# Patient Record
Sex: Female | Born: 1935
Health system: Southern US, Community
[De-identification: ages and names within clinical notes are randomized; demographics above are authoritative.]

## PROBLEM LIST (undated history)

## (undated) DIAGNOSIS — M1990 Unspecified osteoarthritis, unspecified site: Secondary | ICD-10-CM

## (undated) DIAGNOSIS — I10 Essential (primary) hypertension: Secondary | ICD-10-CM

## (undated) DIAGNOSIS — E785 Hyperlipidemia, unspecified: Secondary | ICD-10-CM

## (undated) DIAGNOSIS — F0390 Unspecified dementia without behavioral disturbance: Secondary | ICD-10-CM

## (undated) DIAGNOSIS — I2510 Atherosclerotic heart disease of native coronary artery without angina pectoris: Secondary | ICD-10-CM

## (undated) DIAGNOSIS — K219 Gastro-esophageal reflux disease without esophagitis: Secondary | ICD-10-CM

## (undated) DIAGNOSIS — M419 Scoliosis, unspecified: Secondary | ICD-10-CM

## (undated) DIAGNOSIS — M8580 Other specified disorders of bone density and structure, unspecified site: Secondary | ICD-10-CM

## (undated) DIAGNOSIS — E039 Hypothyroidism, unspecified: Secondary | ICD-10-CM

## (undated) HISTORY — PX: BACK SURGERY: SHX140

## (undated) HISTORY — DX: Gastro-esophageal reflux disease without esophagitis: K21.9

## (undated) HISTORY — DX: Essential (primary) hypertension: I10

## (undated) HISTORY — DX: Hypothyroidism, unspecified: E03.9

## (undated) HISTORY — DX: Other specified disorders of bone density and structure, unspecified site: M85.80

## (undated) HISTORY — PX: APPENDECTOMY: SHX54

## (undated) HISTORY — DX: Unspecified osteoarthritis, unspecified site: M19.90

## (undated) HISTORY — DX: Atherosclerotic heart disease of native coronary artery without angina pectoris: I25.10

## (undated) HISTORY — DX: Hyperlipidemia, unspecified: E78.5

## (undated) HISTORY — DX: Scoliosis, unspecified: M41.9

## (undated) HISTORY — DX: Unspecified dementia, unspecified severity, without behavioral disturbance, psychotic disturbance, mood disturbance, and anxiety: F03.90

## (undated) HISTORY — PX: ABDOMINAL HYSTERECTOMY: SHX81

---

## 1997-09-22 ENCOUNTER — Inpatient Hospital Stay (HOSPITAL_COMMUNITY): Admission: RE | Admit: 1997-09-22 | Discharge: 1997-09-25 | Payer: Self-pay | Admitting: Orthopedic Surgery

## 1999-08-21 ENCOUNTER — Other Ambulatory Visit: Admission: RE | Admit: 1999-08-21 | Discharge: 1999-08-21 | Payer: Self-pay | Admitting: Obstetrics and Gynecology

## 1999-12-04 ENCOUNTER — Inpatient Hospital Stay (HOSPITAL_COMMUNITY): Admission: AD | Admit: 1999-12-04 | Discharge: 1999-12-14 | Payer: Self-pay | Admitting: Cardiology

## 1999-12-05 ENCOUNTER — Encounter: Payer: Self-pay | Admitting: Cardiology

## 1999-12-06 ENCOUNTER — Encounter: Payer: Self-pay | Admitting: Cardiothoracic Surgery

## 1999-12-07 ENCOUNTER — Encounter: Payer: Self-pay | Admitting: Cardiothoracic Surgery

## 1999-12-08 ENCOUNTER — Encounter: Payer: Self-pay | Admitting: Cardiothoracic Surgery

## 1999-12-09 ENCOUNTER — Encounter: Payer: Self-pay | Admitting: Cardiothoracic Surgery

## 2010-12-30 ENCOUNTER — Emergency Department (HOSPITAL_COMMUNITY): Payer: Medicare Other

## 2010-12-30 ENCOUNTER — Emergency Department (HOSPITAL_COMMUNITY)
Admission: EM | Admit: 2010-12-30 | Discharge: 2010-12-30 | Disposition: A | Discharge status: Home / Self Care | Payer: Medicare Other | Attending: Emergency Medicine | Admitting: Emergency Medicine

## 2010-12-30 DIAGNOSIS — M545 Low back pain, unspecified: Secondary | ICD-10-CM | POA: Insufficient documentation

## 2010-12-30 DIAGNOSIS — M25559 Pain in unspecified hip: Secondary | ICD-10-CM | POA: Insufficient documentation

## 2010-12-30 DIAGNOSIS — I10 Essential (primary) hypertension: Secondary | ICD-10-CM | POA: Insufficient documentation

## 2010-12-30 DIAGNOSIS — M5430 Sciatica, unspecified side: Secondary | ICD-10-CM | POA: Insufficient documentation

## 2010-12-30 DIAGNOSIS — M79609 Pain in unspecified limb: Secondary | ICD-10-CM | POA: Insufficient documentation

## 2010-12-30 DIAGNOSIS — E7800 Pure hypercholesterolemia, unspecified: Secondary | ICD-10-CM | POA: Insufficient documentation

## 2012-06-02 IMAGING — CR DG HIP COMPLETE 2+V*R*
3 series · 3 of 3 positions shown · non-contrast
Comparison: None.

CLINICAL DATA: Right hip pain.

RIGHT HIP - COMPLETE 2+ VIEW

[t pelvis a.p.]
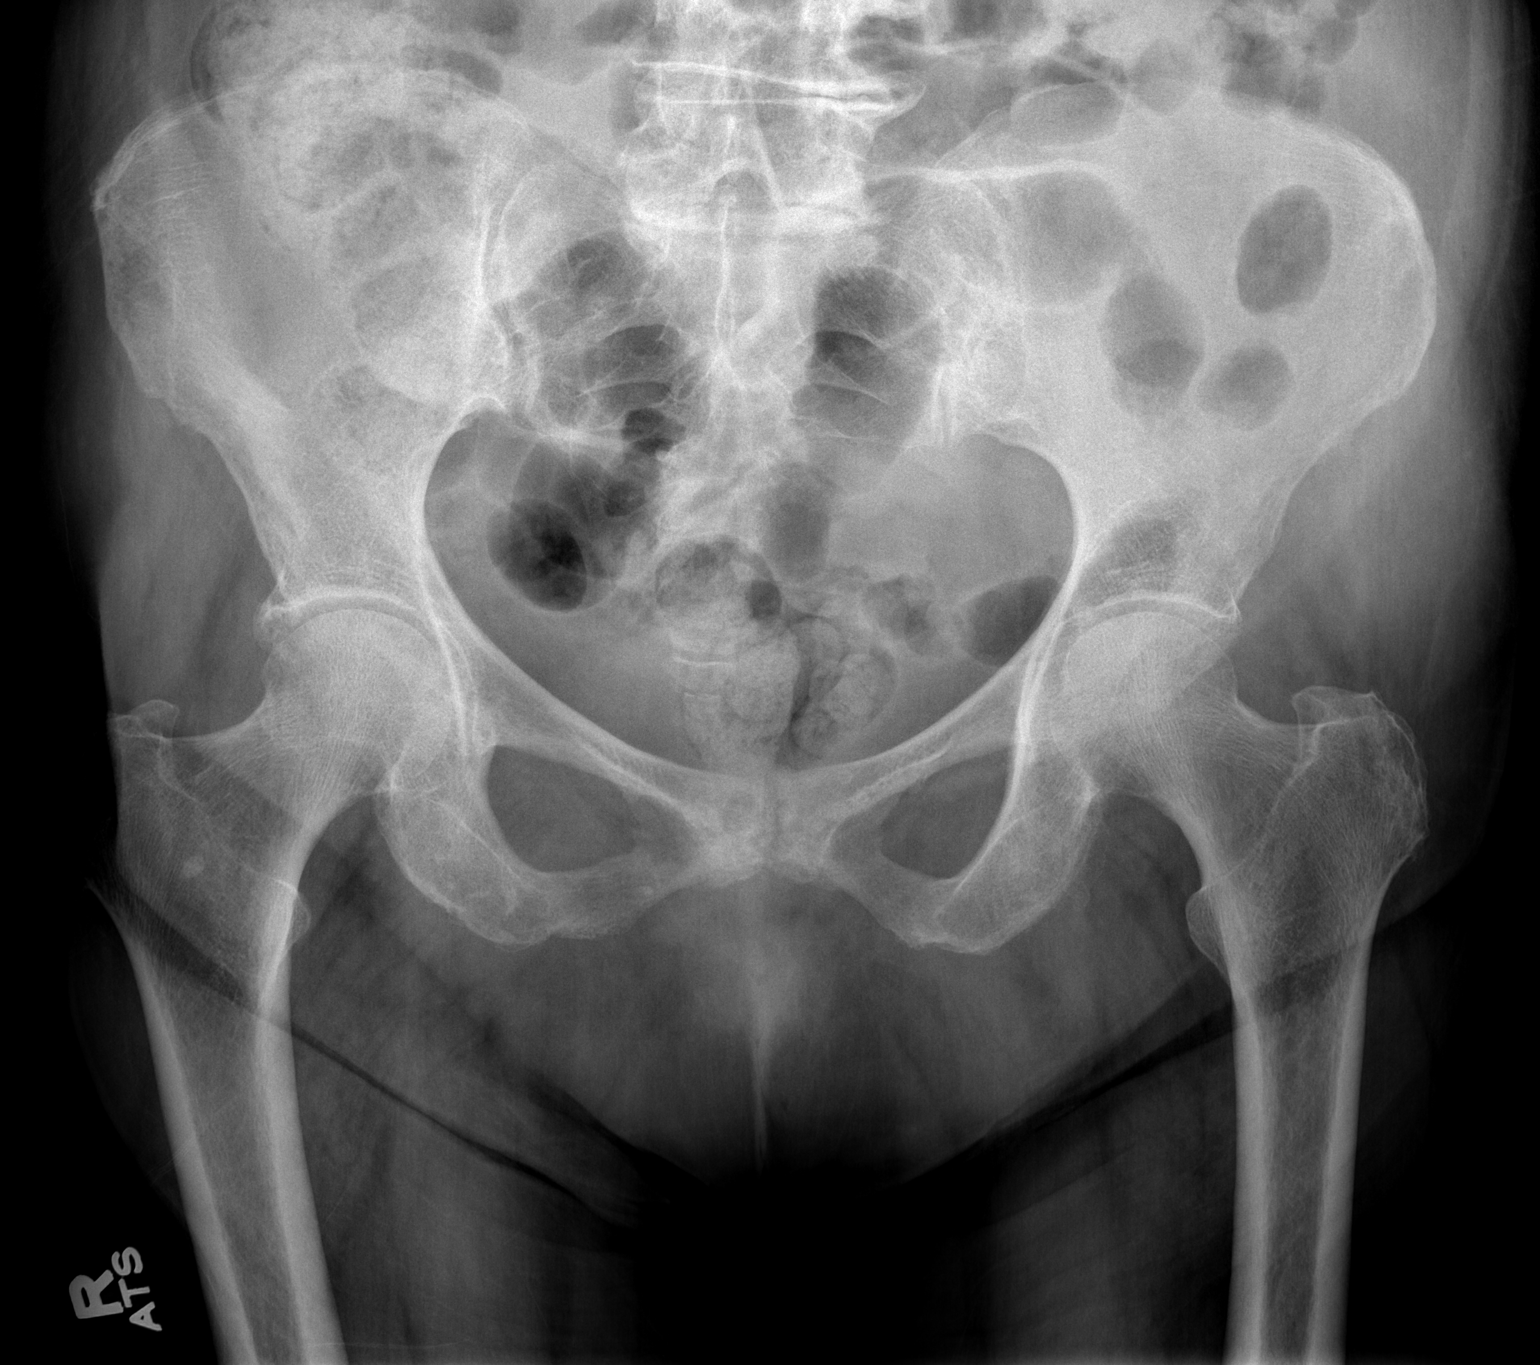

[t hip ap right]
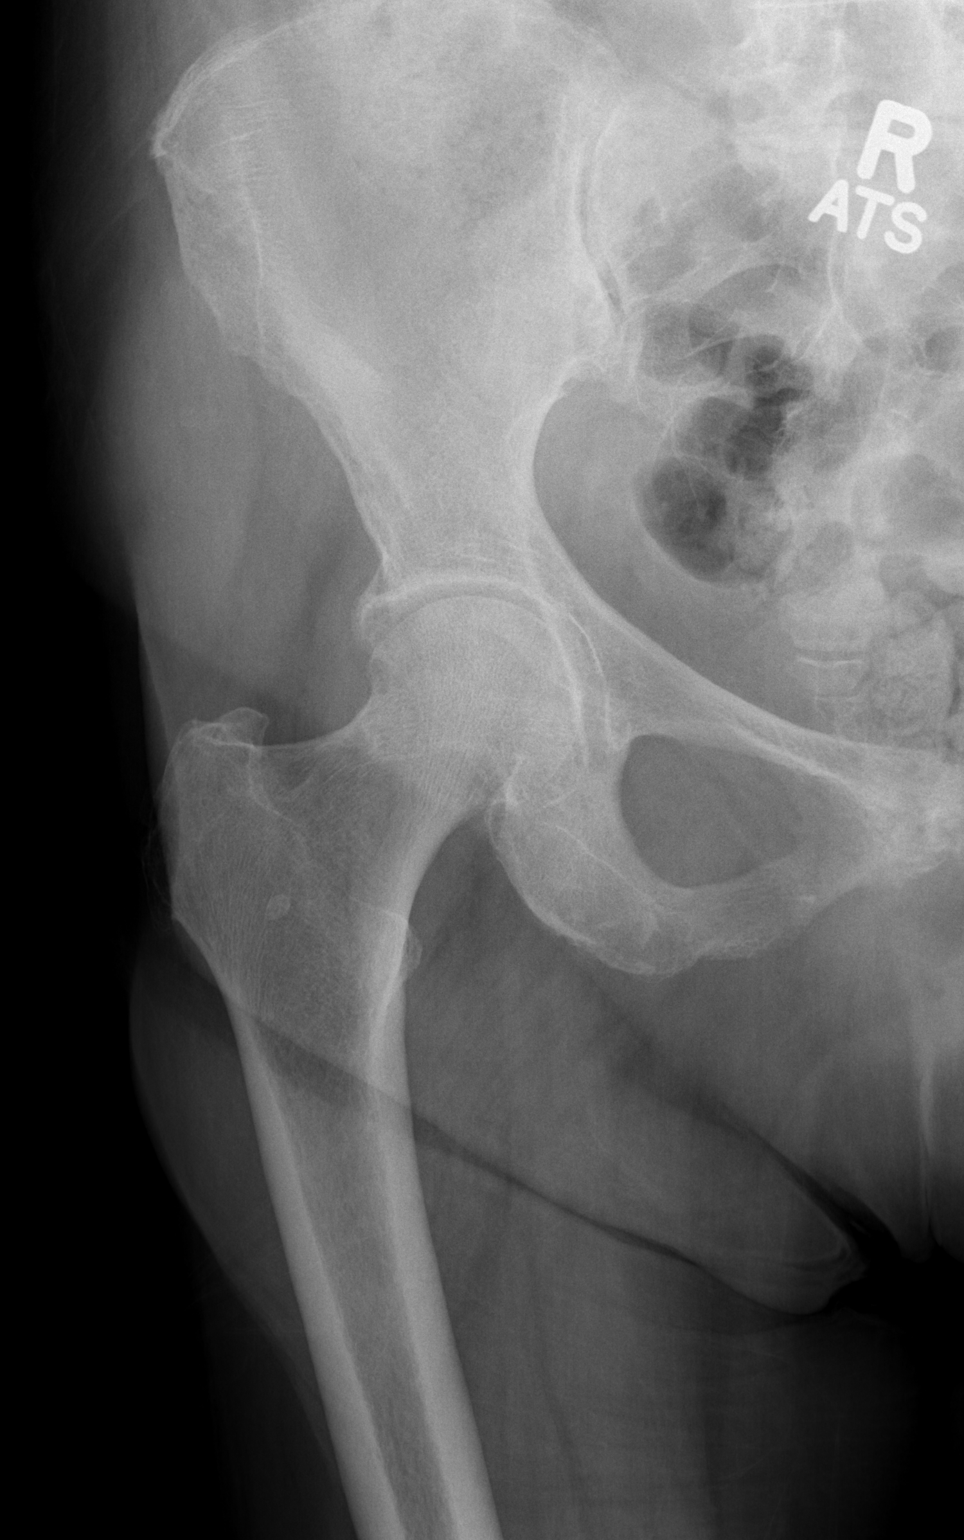

[t hip frog leg right]
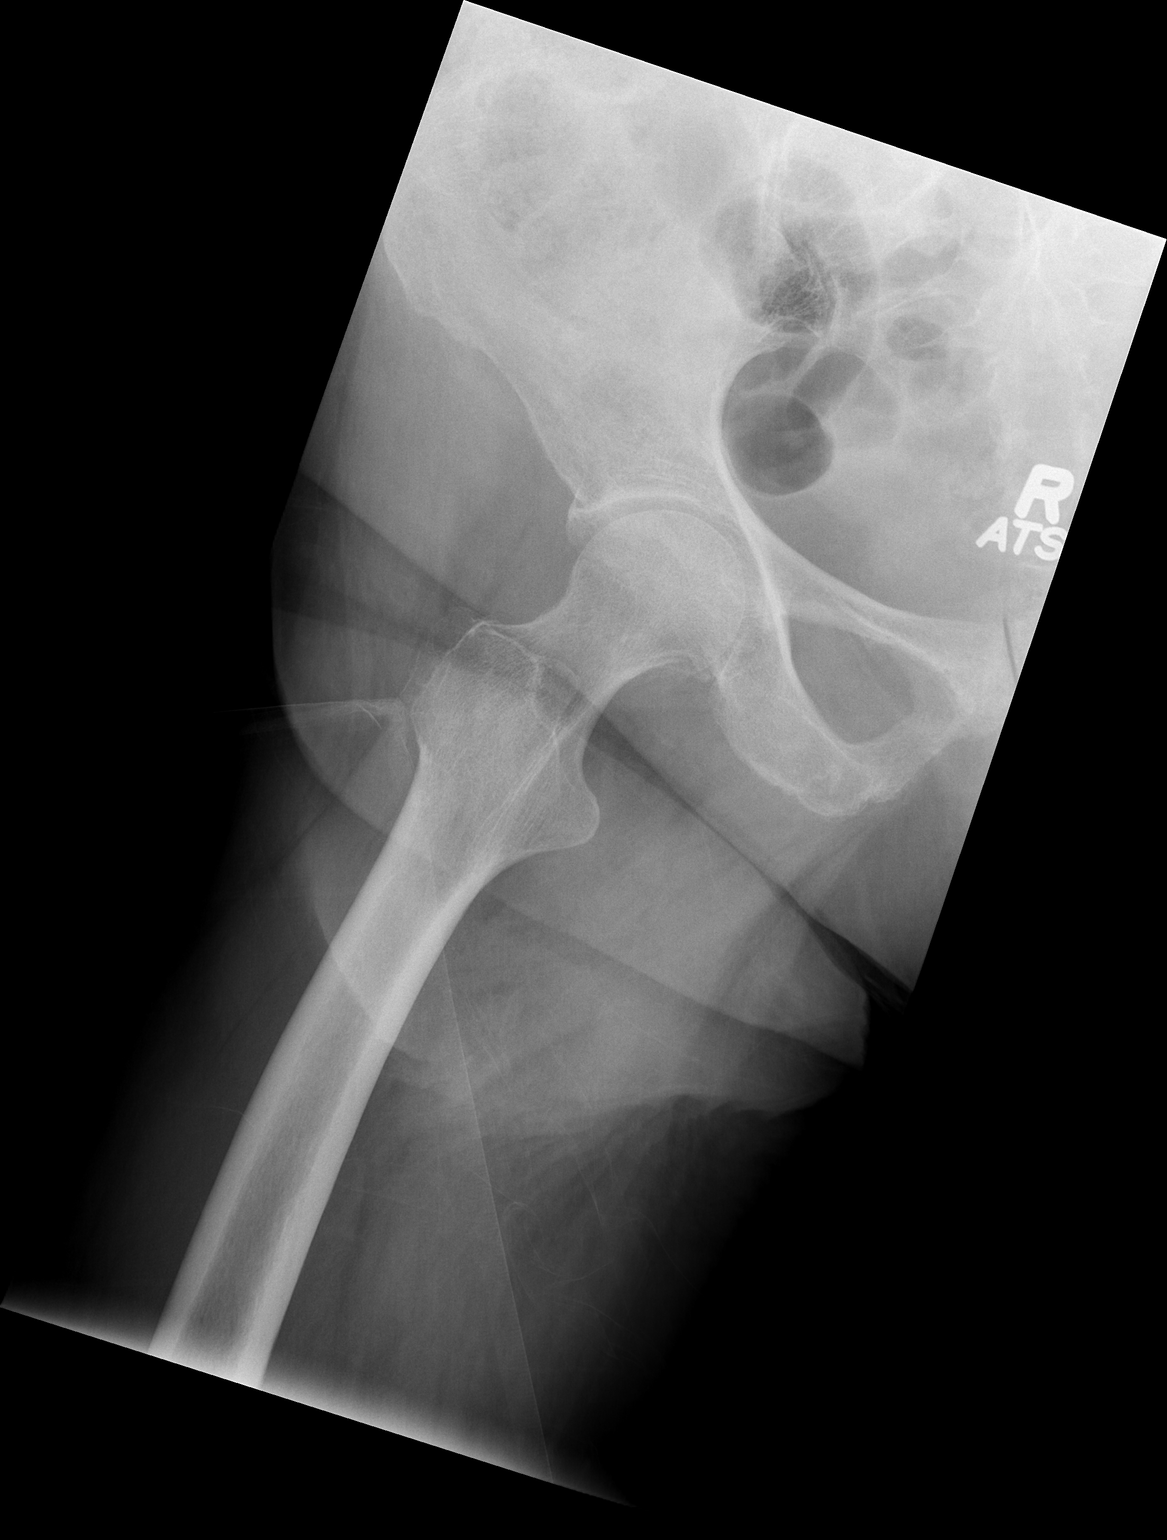

[3 of 3 positions shown; findings below may reference images not displayed]

FINDINGS: AP view of the pelvis and two views of the right hip were
obtained.  Degenerative changes at the pubic symphysis.  Stool in
the visualized colon.  Mild degenerative changes in the right hip.
No evidence for acute fracture or dislocation.
IMPRESSION: No acute bony abnormality to the pelvis or right hip.

Degenerative changes.

## 2013-04-16 ENCOUNTER — Encounter: Payer: Self-pay | Admitting: Surgery

## 2013-05-01 ENCOUNTER — Encounter: Payer: Self-pay | Admitting: Surgery

## 2013-05-04 ENCOUNTER — Ambulatory Visit (INDEPENDENT_AMBULATORY_CARE_PROVIDER_SITE_OTHER): Payer: Medicare Other | Admitting: Surgery

## 2013-05-04 ENCOUNTER — Encounter: Payer: Self-pay | Admitting: Surgery

## 2013-05-04 VITALS — BP 193/51 | HR 47 | Ht 61.0 in | Wt 141.4 lb

## 2013-05-04 DIAGNOSIS — I6529 Occlusion and stenosis of unspecified carotid artery: Secondary | ICD-10-CM

## 2013-05-04 NOTE — Addendum Note (Signed)
Addended by: Adria DillELDRIDGE-LEWIS, Terran Hollenkamp L on: 05/04/2013 04:25 PM   Modules accepted: Orders

## 2013-05-04 NOTE — Progress Notes (Signed)
Patient name: Regina ResidesSarah Joyce Garrelts MRN: [id number] DOB: May 14, 1919 Sex: female   Referred by: Dr. Shary DecampGrisso  Reason for referral:  Chief Complaint  Patient presents with  . Carotid    new pt    HISTORY OF PRESENT ILLNESS: This is a very pleasant 80 year old female who was referred for further evaluation of carotid occlusive disease.  During a workup for vertigo she had a ultrasound which showed greater than 70% stenosis in her left carotid artery.  This was followed up with a CT angiogram which confirmed 70% stenosis in the left carotid artery.  Both vertebral arteries are widely patent.  There is no significant stenosis within the right internal carotid artery.  The patient is asymptomatic.  Specifically, she denies numbness or weakness in either extremity.  She denies slurred speech.  She denies amaurosis fugax.  The patient hypercholesterolemia is managed with a statin.  She is on single agent antiplatelet therapy with aspirin.  She is a nonsmoker.  Past Medical History  Diagnosis Date  . CAD (coronary artery disease)   . Dementia   . GERD (gastroesophageal reflux disease)   . Hyperlipidemia   . Hypertension   . Hypothyroidism   . Osteoarthritis   . Osteopenia   . Scoliosis     Past Surgical History  Procedure Laterality Date  . Abdominal hysterectomy    . Appendectomy      History   Social History  . Marital Status: Widowed    Spouse Name: N/A    Number of Children: N/A  . Years of Education: N/A   Occupational History  . Not on file.   Social History Main Topics  . Smoking status: Former Smoker    Quit date: 03/12/1964  . Smokeless tobacco: Not on file  . Alcohol Use: No  . Drug Use: No  . Sexual Activity: Not on file   Other Topics Concern  . Not on file   Social History Narrative  . No narrative on file    Family History  Problem Relation Age of Onset  . Cancer Mother   . Heart disease Father   . Cancer Daughter   . Hypertension Son      Allergies as of 05/04/2013 - Review Complete 05/04/2013  Allergen Reaction Noted  . Carvedilol Other (See Comments) 04/16/2013  . Codeine sulfate Nausea Only 04/16/2013    Current Outpatient Prescriptions on File Prior to Visit  Medication Sig Dispense Refill  . levothyroxine (SYNTHROID, LEVOTHROID) 50 MCG tablet Take 50 mcg by mouth daily before breakfast.      . lisinopril (PRINIVIL,ZESTRIL) 40 MG tablet Take 40 mg by mouth daily.      . metoprolol succinate (TOPROL-XL) 50 MG 24 hr tablet Take 50 mg by mouth daily. Take with or immediately following a meal.      . omeprazole (PRILOSEC) 40 MG capsule Take 40 mg by mouth daily.      . simvastatin (ZOCOR) 40 MG tablet Take 40 mg by mouth daily.       No current facility-administered medications on file prior to visit.     REVIEW OF SYSTEMS: Cardiovascular: No chest pain, chest pressure, palpitations, orthopnea, or dyspnea on exertion. No claudication or rest pain,  No history of DVT or phlebitis. Pulmonary: No productive cough, asthma or wheezing. Neurologic: No weakness, paresthesias, aphasia, or amaurosis.  Positive for dizziness. Hematologic: No bleeding problems or clotting disorders. Musculoskeletal: No joint pain or joint swelling. Gastrointestinal: No blood in stool or  hematemesis Genitourinary: No dysuria or hematuria. Psychiatric:: No history of major depression. Integumentary: No rashes or ulcers. Constitutional: No fever or chills.  PHYSICAL EXAMINATION: General: The patient appears their stated age.  Vital signs are BP 193/51  Pulse 47  Ht 5\' 1"  (1.549 m)  Wt 141 lb 6.4 oz (64.139 kg)  BMI 26.73 kg/m2  SpO2 100% HEENT:  No gross abnormalities Pulmonary: Respirations are non-labored Abdomen: Soft and non-tender  Musculoskeletal: There are no major deformities.   Neurologic: No focal weakness or paresthesias are detected, Skin: There are no ulcer or rashes noted. Psychiatric: The patient has normal  affect. Cardiovascular: There is a regular rate and rhythm without significant murmur appreciated.  No carotid bruits.  Palpable dorsalis pedis pulse bilaterally  Diagnostic Studies: I have reviewed her CT angiogram as well as her outside ultrasound which showed approximately 70% left carotid stenosis and no significant right carotid stenosis    Assessment:  Asymptomatic carotid disease, left greater than right Plan: I discussed with the patient that I do not feel her symptoms of dizziness are related to her carotid disease.  I feel that she is completely asymptomatic from her 70% left carotid stenosis.  I would recommend continued medical management, consisting of a statin for hypercholesterolemia, antiplatelet therapy with aspirin, and control of hypertension.  I will have her followup again in 6 months for a repeat carotid ultrasound.  We discussed the signs and symptoms of a stroke and what to do should they occur.     Jorge Ny, M.D. [facility] Office: [phone] Pager:  [phone]

## 2013-11-02 ENCOUNTER — Ambulatory Visit: Payer: Medicare Other | Admitting: Surgery

## 2013-11-02 ENCOUNTER — Other Ambulatory Visit (HOSPITAL_COMMUNITY): Payer: Medicare Other

## 2013-11-27 ENCOUNTER — Encounter: Payer: Self-pay | Admitting: Surgery

## 2013-11-30 ENCOUNTER — Ambulatory Visit (INDEPENDENT_AMBULATORY_CARE_PROVIDER_SITE_OTHER): Payer: Medicare Other | Admitting: Surgery

## 2013-11-30 ENCOUNTER — Ambulatory Visit (HOSPITAL_COMMUNITY)
Admission: RE | Admit: 2013-11-30 | Discharge: 2013-11-30 | Disposition: A | Discharge status: Home / Self Care | Payer: Medicare Other | Source: Ambulatory Visit | Attending: Surgery | Admitting: Surgery

## 2013-11-30 ENCOUNTER — Encounter: Payer: Self-pay | Admitting: Surgery

## 2013-11-30 VITALS — BP 160/52 | HR 52 | Ht 61.0 in | Wt 140.1 lb

## 2013-11-30 DIAGNOSIS — I6529 Occlusion and stenosis of unspecified carotid artery: Secondary | ICD-10-CM

## 2013-11-30 DIAGNOSIS — R2689 Other abnormalities of gait and mobility: Secondary | ICD-10-CM

## 2013-11-30 DIAGNOSIS — R29818 Other symptoms and signs involving the nervous system: Secondary | ICD-10-CM

## 2013-11-30 NOTE — Progress Notes (Signed)
HISTORY AND PHYSICAL     CC:  Follow up carotid duplex scan  Referring Provider:  Gordan Payment., MD  HPI: This is a 80 y.o. female who has known carotid stenosis is here for f/u carotid duplex scan.  Denies amaurosis fugax, paresthesias, or hemiparesis.  She states that her only symptom is "staggering" when she walks.  She states that this has been going on for a month, however, her son states that she went to see Dr. Shary Decamp about a year ago for the same problem.  She was referred for PT and went once and stopped.  She denies dizziness or any episodes of the room spinning.    She is on a statin for her cholesterol.   She is not on an aspirin.    Past Medical History  Diagnosis Date  . CAD (coronary artery disease)   . Dementia   . GERD (gastroesophageal reflux disease)   . Hyperlipidemia   . Hypertension   . Hypothyroidism   . Osteoarthritis   . Osteopenia   . Scoliosis    Past Surgical History  Procedure Laterality Date  . Abdominal hysterectomy    . Appendectomy    . Back surgery      Allergies  Allergen Reactions  . Carvedilol Other (See Comments)    bleeding  . Codeine Sulfate Nausea Only    Current Outpatient Prescriptions  Medication Sig Dispense Refill  . levothyroxine (SYNTHROID, LEVOTHROID) 50 MCG tablet Take 50 mcg by mouth daily before breakfast.      . lisinopril (PRINIVIL,ZESTRIL) 40 MG tablet Take 40 mg by mouth daily.      . metoprolol succinate (TOPROL-XL) 50 MG 24 hr tablet Take 50 mg by mouth daily. Take with or immediately following a meal.      . omeprazole (PRILOSEC) 40 MG capsule Take 40 mg by mouth daily.      . simvastatin (ZOCOR) 40 MG tablet Take 40 mg by mouth daily.      Marland Kitchen AMLODIPINE BESYLATE PO Take by mouth.       No current facility-administered medications for this visit.    Pt's meds include: Statin:  Yes.   Beta Blocker:  No. Aspirin:  No. Other antiplatelets/anticoagulants:  No.   Family History  Problem Relation Age of  Onset  . Cancer Mother   . Heart disease Father   . Heart attack Father   . Cancer Daughter   . Hypertension Son   . Hyperlipidemia Son     History   Social History  . Marital Status: Widowed    Spouse Name: N/A    Number of Children: N/A  . Years of Education: N/A   Occupational History  . Not on file.   Social History Main Topics  . Smoking status: Former Smoker    Quit date: 03/12/1964  . Smokeless tobacco: Not on file  . Alcohol Use: No  . Drug Use: No  . Sexual Activity: Not on file   Other Topics Concern  . Not on file   Social History Narrative  . No narrative on file     ROS:  Positive    Negative    All sytems reviewed and are negative  Cardiovascular:  chest pain/pressure  palpitations  SOB lying flat  DOE  pain in legs while walking  pain in feet when lying flat  hx of DVT  hx of phlebitis  swelling in legs  varicose veins  Pulmonary:  productive cough   asthma  wheezing  Neurologic:  weakness in  arms  legs-described as a staggering when walking  numbness in  arms  legs difficulty speaking or slurred speech  temporary loss of vision in one eye  dizziness  Hematologic:  bleeding problems  problems with blood clotting easily  GI  vomiting blood  blood in stool  GU:  burning with urination  blood in urine  Psychiatric:  hx of major depression  Integumentary:  rashes  ulcers  Constitutional:  fever  chills   PHYSICAL EXAMINATION:  Filed Vitals:   11/30/13 1327  BP: 195/65  Pulse:    Body mass index is 26.49 kg/(m^2).  General:  WDWN in NAD Gait: Normal HENT: WNL; normocephalic Eyes: PERRL Pulmonary: normal non-labored breathing , without Rales, rhonchi,  wheezing Cardiac: RRR, without  Murmurs, rubs or gallops; without carotid bruits Abdomen: soft, NT, no masses Skin: without rashes,  ulcers  Vascular Exam/Pulses:   Right Left    Radial 2+ (normal) 2+ (normal)  Ulnar 1+ (weak) 1+ (weak)  Popliteal Unable to palpate  Unable to palpate   DP 2+ (normal) 2+ (normal)  PT Unable to palpate  Unable to palpate     Extremities: without ischemic changes, without Gangrene , without cellulitis; without open wounds;  Musculoskeletal: without muscle wasting or atrophy  Neurologic: A&O X 3; Appropriate Affect ; SENSATION: normal; MOTOR FUNCTION:  moving all extremities equally. Speech is fluent/normal   Non-Invasive Vascular Imaging: Carotid Duplex Scan:  11/30/2013  1.  Less than 40% RICA stenosis 2.  60-79% LICA stenosis  Essentially unchanged from prior exam at outside facility  ASSESSMENT: 80 y.o. female here for f/u carotid duplex scan for her asymptomatic carotid artery stenosis.  PLAN: -pt overall doing well, but her main complaint is her staggering when she walks.  This most likely is not coming from her carotid artery stenosis.   -will refer her to a neurologist to help determine cause  -will have her f/u with Dr. Myra Gianotti in 6 months with a repeat carotid duplex.  Her BP was 195 systolic-we did recheck this at the end of the visit and it was down to 160 systolic.   Doreatha Massed, PA-C [facility] [phone]  Clinic MD:   Pt seen and examined in conjunction with Dr. Myra Gianotti  For VQI Use Only    PRE-ADM LIVING: [ x] Home,  Nursing home,  Homeless  AMB STATUS: [x ] Walking,  Walking w/ Assistance,  Wheelchair, Bed ridden  RECENT HEART ATTACK (<6 mon): No  CAD Sx: [x ] No,  Asx, h/o MI,  Stable angina,  Unstable angina  PRIOR CHF: [x ] No,  Asx,  Mild,  Moderate,  Severe  STRESS TEST: [x ] No,  Normal,  + ischemia,  + MI,  Both    I agree with the above.  I have seen and examined the patient.  She has a 60-79% left carotid stenosis.  I do not feel that this is contributing to her symptoms.  I suspect that she has some inner  ear disorder.  I am going to refer her to neurology for further evaluation.  I will see her back in 6 months for a repeat carotid ultrasound  Wells Brabham

## 2013-11-30 NOTE — Addendum Note (Signed)
Addended by: Sharee Pimple on: 11/30/2013 05:49 PM   Modules accepted: Orders

## 2014-05-28 ENCOUNTER — Encounter: Payer: Self-pay | Admitting: Surgery

## 2014-05-31 ENCOUNTER — Ambulatory Visit: Payer: Medicare Other | Admitting: Surgery

## 2014-05-31 ENCOUNTER — Inpatient Hospital Stay (HOSPITAL_COMMUNITY): Admission: RE | Admit: 2014-05-31 | Payer: Medicare Other | Source: Ambulatory Visit

## 2019-06-27 DIAGNOSIS — E039 Hypothyroidism, unspecified: Secondary | ICD-10-CM

## 2019-06-27 DIAGNOSIS — R2681 Unsteadiness on feet: Secondary | ICD-10-CM

## 2019-06-27 DIAGNOSIS — R262 Difficulty in walking, not elsewhere classified: Secondary | ICD-10-CM

## 2019-06-27 DIAGNOSIS — E785 Hyperlipidemia, unspecified: Secondary | ICD-10-CM

## 2019-06-27 DIAGNOSIS — M5136 Other intervertebral disc degeneration, lumbar region: Secondary | ICD-10-CM

## 2019-12-16 DIAGNOSIS — F05 Delirium due to known physiological condition: Secondary | ICD-10-CM | POA: Diagnosis not present

## 2019-12-16 DIAGNOSIS — F0391 Unspecified dementia with behavioral disturbance: Secondary | ICD-10-CM | POA: Diagnosis not present

## 2019-12-16 DIAGNOSIS — F5102 Adjustment insomnia: Secondary | ICD-10-CM | POA: Diagnosis not present

## 2019-12-17 DIAGNOSIS — F0390 Unspecified dementia without behavioral disturbance: Secondary | ICD-10-CM | POA: Diagnosis not present

## 2019-12-17 DIAGNOSIS — I129 Hypertensive chronic kidney disease with stage 1 through stage 4 chronic kidney disease, or unspecified chronic kidney disease: Secondary | ICD-10-CM | POA: Diagnosis not present

## 2019-12-17 DIAGNOSIS — G894 Chronic pain syndrome: Secondary | ICD-10-CM | POA: Diagnosis not present

## 2019-12-17 DIAGNOSIS — N1832 Chronic kidney disease, stage 3b: Secondary | ICD-10-CM | POA: Diagnosis not present

## 2019-12-17 DIAGNOSIS — M5186 Other intervertebral disc disorders, lumbar region: Secondary | ICD-10-CM | POA: Diagnosis not present

## 2019-12-17 DIAGNOSIS — E039 Hypothyroidism, unspecified: Secondary | ICD-10-CM | POA: Diagnosis not present

## 2019-12-18 DIAGNOSIS — E1165 Type 2 diabetes mellitus with hyperglycemia: Secondary | ICD-10-CM | POA: Diagnosis not present

## 2019-12-18 DIAGNOSIS — D649 Anemia, unspecified: Secondary | ICD-10-CM | POA: Diagnosis not present

## 2019-12-18 DIAGNOSIS — E039 Hypothyroidism, unspecified: Secondary | ICD-10-CM | POA: Diagnosis not present

## 2019-12-18 DIAGNOSIS — N179 Acute kidney failure, unspecified: Secondary | ICD-10-CM | POA: Diagnosis not present

## 2019-12-18 DIAGNOSIS — I10 Essential (primary) hypertension: Secondary | ICD-10-CM | POA: Diagnosis not present

## 2019-12-21 DIAGNOSIS — H53142 Visual discomfort, left eye: Secondary | ICD-10-CM | POA: Diagnosis not present

## 2019-12-21 DIAGNOSIS — F0390 Unspecified dementia without behavioral disturbance: Secondary | ICD-10-CM | POA: Diagnosis not present

## 2019-12-21 DIAGNOSIS — H00026 Hordeolum internum left eye, unspecified eyelid: Secondary | ICD-10-CM | POA: Diagnosis not present

## 2019-12-25 DIAGNOSIS — F0390 Unspecified dementia without behavioral disturbance: Secondary | ICD-10-CM | POA: Diagnosis not present

## 2019-12-25 DIAGNOSIS — R7303 Prediabetes: Secondary | ICD-10-CM | POA: Diagnosis not present

## 2019-12-25 DIAGNOSIS — R799 Abnormal finding of blood chemistry, unspecified: Secondary | ICD-10-CM | POA: Diagnosis not present

## 2019-12-28 DIAGNOSIS — F0390 Unspecified dementia without behavioral disturbance: Secondary | ICD-10-CM | POA: Diagnosis not present

## 2019-12-28 DIAGNOSIS — L853 Xerosis cutis: Secondary | ICD-10-CM | POA: Diagnosis not present

## 2019-12-28 DIAGNOSIS — I739 Peripheral vascular disease, unspecified: Secondary | ICD-10-CM | POA: Diagnosis not present

## 2020-01-13 DIAGNOSIS — F0390 Unspecified dementia without behavioral disturbance: Secondary | ICD-10-CM | POA: Diagnosis not present

## 2020-01-13 DIAGNOSIS — F05 Delirium due to known physiological condition: Secondary | ICD-10-CM | POA: Diagnosis not present

## 2020-01-13 DIAGNOSIS — F5102 Adjustment insomnia: Secondary | ICD-10-CM | POA: Diagnosis not present

## 2020-01-13 DIAGNOSIS — F0391 Unspecified dementia with behavioral disturbance: Secondary | ICD-10-CM | POA: Diagnosis not present

## 2020-01-13 DIAGNOSIS — R197 Diarrhea, unspecified: Secondary | ICD-10-CM | POA: Diagnosis not present

## 2020-01-21 DIAGNOSIS — F0390 Unspecified dementia without behavioral disturbance: Secondary | ICD-10-CM | POA: Diagnosis not present

## 2020-01-21 DIAGNOSIS — L304 Erythema intertrigo: Secondary | ICD-10-CM | POA: Diagnosis not present

## 2020-01-27 DIAGNOSIS — R269 Unspecified abnormalities of gait and mobility: Secondary | ICD-10-CM | POA: Diagnosis not present

## 2020-01-27 DIAGNOSIS — W19XXXA Unspecified fall, initial encounter: Secondary | ICD-10-CM | POA: Diagnosis not present

## 2020-01-27 DIAGNOSIS — F0390 Unspecified dementia without behavioral disturbance: Secondary | ICD-10-CM | POA: Diagnosis not present

## 2020-01-27 DIAGNOSIS — Z9181 History of falling: Secondary | ICD-10-CM | POA: Diagnosis not present

## 2020-01-27 DIAGNOSIS — M6281 Muscle weakness (generalized): Secondary | ICD-10-CM | POA: Diagnosis not present

## 2020-01-27 DIAGNOSIS — Z8673 Personal history of transient ischemic attack (TIA), and cerebral infarction without residual deficits: Secondary | ICD-10-CM | POA: Diagnosis not present

## 2020-02-12 DIAGNOSIS — F0391 Unspecified dementia with behavioral disturbance: Secondary | ICD-10-CM | POA: Diagnosis not present

## 2020-02-12 DIAGNOSIS — F05 Delirium due to known physiological condition: Secondary | ICD-10-CM | POA: Diagnosis not present

## 2020-02-12 DIAGNOSIS — F0390 Unspecified dementia without behavioral disturbance: Secondary | ICD-10-CM | POA: Diagnosis not present

## 2020-02-12 DIAGNOSIS — Z9181 History of falling: Secondary | ICD-10-CM | POA: Diagnosis not present

## 2020-02-12 DIAGNOSIS — W19XXXA Unspecified fall, initial encounter: Secondary | ICD-10-CM | POA: Diagnosis not present

## 2020-02-12 DIAGNOSIS — M6281 Muscle weakness (generalized): Secondary | ICD-10-CM | POA: Diagnosis not present

## 2020-02-12 DIAGNOSIS — R269 Unspecified abnormalities of gait and mobility: Secondary | ICD-10-CM | POA: Diagnosis not present

## 2020-02-12 DIAGNOSIS — F5102 Adjustment insomnia: Secondary | ICD-10-CM | POA: Diagnosis not present

## 2020-02-12 DIAGNOSIS — R2689 Other abnormalities of gait and mobility: Secondary | ICD-10-CM | POA: Diagnosis not present

## 2020-02-16 DIAGNOSIS — Z20828 Contact with and (suspected) exposure to other viral communicable diseases: Secondary | ICD-10-CM | POA: Diagnosis not present

## 2020-02-24 DIAGNOSIS — Z20828 Contact with and (suspected) exposure to other viral communicable diseases: Secondary | ICD-10-CM | POA: Diagnosis not present

## 2020-02-26 DIAGNOSIS — R197 Diarrhea, unspecified: Secondary | ICD-10-CM | POA: Diagnosis not present

## 2020-02-26 DIAGNOSIS — F0390 Unspecified dementia without behavioral disturbance: Secondary | ICD-10-CM | POA: Diagnosis not present

## 2020-03-09 DIAGNOSIS — W19XXXA Unspecified fall, initial encounter: Secondary | ICD-10-CM | POA: Diagnosis not present

## 2020-03-09 DIAGNOSIS — R2689 Other abnormalities of gait and mobility: Secondary | ICD-10-CM | POA: Diagnosis not present

## 2020-03-09 DIAGNOSIS — R269 Unspecified abnormalities of gait and mobility: Secondary | ICD-10-CM | POA: Diagnosis not present

## 2020-03-09 DIAGNOSIS — Z9181 History of falling: Secondary | ICD-10-CM | POA: Diagnosis not present

## 2020-03-09 DIAGNOSIS — Z8673 Personal history of transient ischemic attack (TIA), and cerebral infarction without residual deficits: Secondary | ICD-10-CM | POA: Diagnosis not present

## 2020-03-09 DIAGNOSIS — M6281 Muscle weakness (generalized): Secondary | ICD-10-CM | POA: Diagnosis not present

## 2020-03-09 DIAGNOSIS — F0390 Unspecified dementia without behavioral disturbance: Secondary | ICD-10-CM | POA: Diagnosis not present

## 2020-03-16 DIAGNOSIS — F0391 Unspecified dementia with behavioral disturbance: Secondary | ICD-10-CM | POA: Diagnosis not present

## 2020-03-16 DIAGNOSIS — F5102 Adjustment insomnia: Secondary | ICD-10-CM | POA: Diagnosis not present

## 2020-03-16 DIAGNOSIS — F05 Delirium due to known physiological condition: Secondary | ICD-10-CM | POA: Diagnosis not present

## 2020-03-21 DIAGNOSIS — Z20828 Contact with and (suspected) exposure to other viral communicable diseases: Secondary | ICD-10-CM | POA: Diagnosis not present

## 2020-03-24 DIAGNOSIS — F0391 Unspecified dementia with behavioral disturbance: Secondary | ICD-10-CM | POA: Diagnosis not present

## 2020-03-24 DIAGNOSIS — F5102 Adjustment insomnia: Secondary | ICD-10-CM | POA: Diagnosis not present

## 2020-03-29 DIAGNOSIS — Z20828 Contact with and (suspected) exposure to other viral communicable diseases: Secondary | ICD-10-CM | POA: Diagnosis not present

## 2020-04-01 DIAGNOSIS — R112 Nausea with vomiting, unspecified: Secondary | ICD-10-CM | POA: Diagnosis not present

## 2020-04-01 DIAGNOSIS — F0390 Unspecified dementia without behavioral disturbance: Secondary | ICD-10-CM | POA: Diagnosis not present

## 2020-04-05 DIAGNOSIS — Z20828 Contact with and (suspected) exposure to other viral communicable diseases: Secondary | ICD-10-CM | POA: Diagnosis not present

## 2020-04-07 DIAGNOSIS — I129 Hypertensive chronic kidney disease with stage 1 through stage 4 chronic kidney disease, or unspecified chronic kidney disease: Secondary | ICD-10-CM | POA: Diagnosis not present

## 2020-04-07 DIAGNOSIS — R7303 Prediabetes: Secondary | ICD-10-CM | POA: Diagnosis not present

## 2020-04-07 DIAGNOSIS — E039 Hypothyroidism, unspecified: Secondary | ICD-10-CM | POA: Diagnosis not present

## 2020-04-07 DIAGNOSIS — N1832 Chronic kidney disease, stage 3b: Secondary | ICD-10-CM | POA: Diagnosis not present

## 2020-04-07 DIAGNOSIS — F0390 Unspecified dementia without behavioral disturbance: Secondary | ICD-10-CM | POA: Diagnosis not present

## 2020-04-12 DIAGNOSIS — Z20828 Contact with and (suspected) exposure to other viral communicable diseases: Secondary | ICD-10-CM | POA: Diagnosis not present

## 2020-04-13 DIAGNOSIS — E119 Type 2 diabetes mellitus without complications: Secondary | ICD-10-CM | POA: Diagnosis not present

## 2020-04-13 DIAGNOSIS — F0391 Unspecified dementia with behavioral disturbance: Secondary | ICD-10-CM | POA: Diagnosis not present

## 2020-04-13 DIAGNOSIS — D649 Anemia, unspecified: Secondary | ICD-10-CM | POA: Diagnosis not present

## 2020-04-13 DIAGNOSIS — F05 Delirium due to known physiological condition: Secondary | ICD-10-CM | POA: Diagnosis not present

## 2020-04-13 DIAGNOSIS — F5102 Adjustment insomnia: Secondary | ICD-10-CM | POA: Diagnosis not present

## 2020-04-13 DIAGNOSIS — I10 Essential (primary) hypertension: Secondary | ICD-10-CM | POA: Diagnosis not present

## 2020-04-14 DIAGNOSIS — R7303 Prediabetes: Secondary | ICD-10-CM | POA: Diagnosis not present

## 2020-04-14 DIAGNOSIS — F0390 Unspecified dementia without behavioral disturbance: Secondary | ICD-10-CM | POA: Diagnosis not present

## 2020-04-14 DIAGNOSIS — R799 Abnormal finding of blood chemistry, unspecified: Secondary | ICD-10-CM | POA: Diagnosis not present

## 2020-04-18 DIAGNOSIS — R269 Unspecified abnormalities of gait and mobility: Secondary | ICD-10-CM | POA: Diagnosis not present

## 2020-04-18 DIAGNOSIS — F0390 Unspecified dementia without behavioral disturbance: Secondary | ICD-10-CM | POA: Diagnosis not present

## 2020-04-18 DIAGNOSIS — M6281 Muscle weakness (generalized): Secondary | ICD-10-CM | POA: Diagnosis not present

## 2020-04-18 DIAGNOSIS — R635 Abnormal weight gain: Secondary | ICD-10-CM | POA: Diagnosis not present

## 2020-04-20 DIAGNOSIS — R269 Unspecified abnormalities of gait and mobility: Secondary | ICD-10-CM | POA: Diagnosis not present

## 2020-04-20 DIAGNOSIS — G309 Alzheimer's disease, unspecified: Secondary | ICD-10-CM | POA: Diagnosis not present

## 2020-04-20 DIAGNOSIS — Z9181 History of falling: Secondary | ICD-10-CM | POA: Diagnosis not present

## 2020-04-20 DIAGNOSIS — Z8673 Personal history of transient ischemic attack (TIA), and cerebral infarction without residual deficits: Secondary | ICD-10-CM | POA: Diagnosis not present

## 2020-04-20 DIAGNOSIS — S0081XA Abrasion of other part of head, initial encounter: Secondary | ICD-10-CM | POA: Diagnosis not present

## 2020-04-20 DIAGNOSIS — W19XXXA Unspecified fall, initial encounter: Secondary | ICD-10-CM | POA: Diagnosis not present

## 2020-04-20 DIAGNOSIS — R2689 Other abnormalities of gait and mobility: Secondary | ICD-10-CM | POA: Diagnosis not present

## 2020-04-20 DIAGNOSIS — M6281 Muscle weakness (generalized): Secondary | ICD-10-CM | POA: Diagnosis not present

## 2020-04-20 DIAGNOSIS — Z20828 Contact with and (suspected) exposure to other viral communicable diseases: Secondary | ICD-10-CM | POA: Diagnosis not present

## 2020-04-22 DIAGNOSIS — S0081XD Abrasion of other part of head, subsequent encounter: Secondary | ICD-10-CM | POA: Diagnosis not present

## 2020-04-22 DIAGNOSIS — K219 Gastro-esophageal reflux disease without esophagitis: Secondary | ICD-10-CM | POA: Diagnosis not present

## 2020-04-22 DIAGNOSIS — M5136 Other intervertebral disc degeneration, lumbar region: Secondary | ICD-10-CM | POA: Diagnosis not present

## 2020-04-22 DIAGNOSIS — E039 Hypothyroidism, unspecified: Secondary | ICD-10-CM | POA: Diagnosis not present

## 2020-04-22 DIAGNOSIS — F0280 Dementia in other diseases classified elsewhere without behavioral disturbance: Secondary | ICD-10-CM | POA: Diagnosis not present

## 2020-04-22 DIAGNOSIS — E785 Hyperlipidemia, unspecified: Secondary | ICD-10-CM | POA: Diagnosis not present

## 2020-04-22 DIAGNOSIS — H9190 Unspecified hearing loss, unspecified ear: Secondary | ICD-10-CM | POA: Diagnosis not present

## 2020-04-22 DIAGNOSIS — G309 Alzheimer's disease, unspecified: Secondary | ICD-10-CM | POA: Diagnosis not present

## 2020-04-22 DIAGNOSIS — I10 Essential (primary) hypertension: Secondary | ICD-10-CM | POA: Diagnosis not present

## 2020-04-26 DIAGNOSIS — F0280 Dementia in other diseases classified elsewhere without behavioral disturbance: Secondary | ICD-10-CM | POA: Diagnosis not present

## 2020-04-26 DIAGNOSIS — I10 Essential (primary) hypertension: Secondary | ICD-10-CM | POA: Diagnosis not present

## 2020-04-26 DIAGNOSIS — K219 Gastro-esophageal reflux disease without esophagitis: Secondary | ICD-10-CM | POA: Diagnosis not present

## 2020-04-26 DIAGNOSIS — E785 Hyperlipidemia, unspecified: Secondary | ICD-10-CM | POA: Diagnosis not present

## 2020-04-26 DIAGNOSIS — E039 Hypothyroidism, unspecified: Secondary | ICD-10-CM | POA: Diagnosis not present

## 2020-04-26 DIAGNOSIS — G309 Alzheimer's disease, unspecified: Secondary | ICD-10-CM | POA: Diagnosis not present

## 2020-04-26 DIAGNOSIS — M5136 Other intervertebral disc degeneration, lumbar region: Secondary | ICD-10-CM | POA: Diagnosis not present

## 2020-04-26 DIAGNOSIS — S0081XD Abrasion of other part of head, subsequent encounter: Secondary | ICD-10-CM | POA: Diagnosis not present

## 2020-04-26 DIAGNOSIS — H9190 Unspecified hearing loss, unspecified ear: Secondary | ICD-10-CM | POA: Diagnosis not present

## 2020-04-27 DIAGNOSIS — Z20828 Contact with and (suspected) exposure to other viral communicable diseases: Secondary | ICD-10-CM | POA: Diagnosis not present

## 2020-04-29 DIAGNOSIS — H9190 Unspecified hearing loss, unspecified ear: Secondary | ICD-10-CM | POA: Diagnosis not present

## 2020-04-29 DIAGNOSIS — K219 Gastro-esophageal reflux disease without esophagitis: Secondary | ICD-10-CM | POA: Diagnosis not present

## 2020-04-29 DIAGNOSIS — E785 Hyperlipidemia, unspecified: Secondary | ICD-10-CM | POA: Diagnosis not present

## 2020-04-29 DIAGNOSIS — E039 Hypothyroidism, unspecified: Secondary | ICD-10-CM | POA: Diagnosis not present

## 2020-04-29 DIAGNOSIS — M5136 Other intervertebral disc degeneration, lumbar region: Secondary | ICD-10-CM | POA: Diagnosis not present

## 2020-04-29 DIAGNOSIS — I10 Essential (primary) hypertension: Secondary | ICD-10-CM | POA: Diagnosis not present

## 2020-04-29 DIAGNOSIS — G309 Alzheimer's disease, unspecified: Secondary | ICD-10-CM | POA: Diagnosis not present

## 2020-04-29 DIAGNOSIS — S0081XD Abrasion of other part of head, subsequent encounter: Secondary | ICD-10-CM | POA: Diagnosis not present

## 2020-04-29 DIAGNOSIS — F0280 Dementia in other diseases classified elsewhere without behavioral disturbance: Secondary | ICD-10-CM | POA: Diagnosis not present

## 2020-05-03 DIAGNOSIS — K219 Gastro-esophageal reflux disease without esophagitis: Secondary | ICD-10-CM | POA: Diagnosis not present

## 2020-05-03 DIAGNOSIS — E785 Hyperlipidemia, unspecified: Secondary | ICD-10-CM | POA: Diagnosis not present

## 2020-05-03 DIAGNOSIS — I10 Essential (primary) hypertension: Secondary | ICD-10-CM | POA: Diagnosis not present

## 2020-05-03 DIAGNOSIS — F0280 Dementia in other diseases classified elsewhere without behavioral disturbance: Secondary | ICD-10-CM | POA: Diagnosis not present

## 2020-05-03 DIAGNOSIS — G309 Alzheimer's disease, unspecified: Secondary | ICD-10-CM | POA: Diagnosis not present

## 2020-05-03 DIAGNOSIS — S0081XD Abrasion of other part of head, subsequent encounter: Secondary | ICD-10-CM | POA: Diagnosis not present

## 2020-05-03 DIAGNOSIS — H9190 Unspecified hearing loss, unspecified ear: Secondary | ICD-10-CM | POA: Diagnosis not present

## 2020-05-03 DIAGNOSIS — E039 Hypothyroidism, unspecified: Secondary | ICD-10-CM | POA: Diagnosis not present

## 2020-05-03 DIAGNOSIS — M5136 Other intervertebral disc degeneration, lumbar region: Secondary | ICD-10-CM | POA: Diagnosis not present

## 2020-05-05 DIAGNOSIS — G309 Alzheimer's disease, unspecified: Secondary | ICD-10-CM | POA: Diagnosis not present

## 2020-05-05 DIAGNOSIS — F0280 Dementia in other diseases classified elsewhere without behavioral disturbance: Secondary | ICD-10-CM | POA: Diagnosis not present

## 2020-05-05 DIAGNOSIS — E039 Hypothyroidism, unspecified: Secondary | ICD-10-CM | POA: Diagnosis not present

## 2020-05-05 DIAGNOSIS — S0081XD Abrasion of other part of head, subsequent encounter: Secondary | ICD-10-CM | POA: Diagnosis not present

## 2020-05-05 DIAGNOSIS — I10 Essential (primary) hypertension: Secondary | ICD-10-CM | POA: Diagnosis not present

## 2020-05-05 DIAGNOSIS — H9190 Unspecified hearing loss, unspecified ear: Secondary | ICD-10-CM | POA: Diagnosis not present

## 2020-05-05 DIAGNOSIS — M5136 Other intervertebral disc degeneration, lumbar region: Secondary | ICD-10-CM | POA: Diagnosis not present

## 2020-05-05 DIAGNOSIS — K219 Gastro-esophageal reflux disease without esophagitis: Secondary | ICD-10-CM | POA: Diagnosis not present

## 2020-05-05 DIAGNOSIS — E785 Hyperlipidemia, unspecified: Secondary | ICD-10-CM | POA: Diagnosis not present

## 2020-05-09 DIAGNOSIS — R2689 Other abnormalities of gait and mobility: Secondary | ICD-10-CM | POA: Diagnosis not present

## 2020-05-09 DIAGNOSIS — R269 Unspecified abnormalities of gait and mobility: Secondary | ICD-10-CM | POA: Diagnosis not present

## 2020-05-09 DIAGNOSIS — M6281 Muscle weakness (generalized): Secondary | ICD-10-CM | POA: Diagnosis not present

## 2020-05-09 DIAGNOSIS — F0390 Unspecified dementia without behavioral disturbance: Secondary | ICD-10-CM | POA: Diagnosis not present

## 2020-05-09 DIAGNOSIS — Z9181 History of falling: Secondary | ICD-10-CM | POA: Diagnosis not present

## 2020-05-09 DIAGNOSIS — W19XXXA Unspecified fall, initial encounter: Secondary | ICD-10-CM | POA: Diagnosis not present

## 2020-05-11 DIAGNOSIS — F5102 Adjustment insomnia: Secondary | ICD-10-CM | POA: Diagnosis not present

## 2020-05-11 DIAGNOSIS — F0391 Unspecified dementia with behavioral disturbance: Secondary | ICD-10-CM | POA: Diagnosis not present

## 2020-05-11 DIAGNOSIS — F05 Delirium due to known physiological condition: Secondary | ICD-10-CM | POA: Diagnosis not present

## 2020-05-12 DIAGNOSIS — I10 Essential (primary) hypertension: Secondary | ICD-10-CM | POA: Diagnosis not present

## 2020-05-12 DIAGNOSIS — E039 Hypothyroidism, unspecified: Secondary | ICD-10-CM | POA: Diagnosis not present

## 2020-05-12 DIAGNOSIS — K219 Gastro-esophageal reflux disease without esophagitis: Secondary | ICD-10-CM | POA: Diagnosis not present

## 2020-05-12 DIAGNOSIS — M5136 Other intervertebral disc degeneration, lumbar region: Secondary | ICD-10-CM | POA: Diagnosis not present

## 2020-05-12 DIAGNOSIS — G309 Alzheimer's disease, unspecified: Secondary | ICD-10-CM | POA: Diagnosis not present

## 2020-05-12 DIAGNOSIS — H9190 Unspecified hearing loss, unspecified ear: Secondary | ICD-10-CM | POA: Diagnosis not present

## 2020-05-12 DIAGNOSIS — E785 Hyperlipidemia, unspecified: Secondary | ICD-10-CM | POA: Diagnosis not present

## 2020-05-12 DIAGNOSIS — F0280 Dementia in other diseases classified elsewhere without behavioral disturbance: Secondary | ICD-10-CM | POA: Diagnosis not present

## 2020-05-12 DIAGNOSIS — S0081XD Abrasion of other part of head, subsequent encounter: Secondary | ICD-10-CM | POA: Diagnosis not present

## 2020-05-19 DIAGNOSIS — E039 Hypothyroidism, unspecified: Secondary | ICD-10-CM | POA: Diagnosis not present

## 2020-05-19 DIAGNOSIS — F0280 Dementia in other diseases classified elsewhere without behavioral disturbance: Secondary | ICD-10-CM | POA: Diagnosis not present

## 2020-05-19 DIAGNOSIS — G309 Alzheimer's disease, unspecified: Secondary | ICD-10-CM | POA: Diagnosis not present

## 2020-05-19 DIAGNOSIS — I10 Essential (primary) hypertension: Secondary | ICD-10-CM | POA: Diagnosis not present

## 2020-05-19 DIAGNOSIS — S0081XD Abrasion of other part of head, subsequent encounter: Secondary | ICD-10-CM | POA: Diagnosis not present

## 2020-05-19 DIAGNOSIS — H9190 Unspecified hearing loss, unspecified ear: Secondary | ICD-10-CM | POA: Diagnosis not present

## 2020-05-19 DIAGNOSIS — M5136 Other intervertebral disc degeneration, lumbar region: Secondary | ICD-10-CM | POA: Diagnosis not present

## 2020-05-19 DIAGNOSIS — K219 Gastro-esophageal reflux disease without esophagitis: Secondary | ICD-10-CM | POA: Diagnosis not present

## 2020-05-19 DIAGNOSIS — E785 Hyperlipidemia, unspecified: Secondary | ICD-10-CM | POA: Diagnosis not present

## 2020-05-22 DIAGNOSIS — E039 Hypothyroidism, unspecified: Secondary | ICD-10-CM | POA: Diagnosis not present

## 2020-05-22 DIAGNOSIS — F0280 Dementia in other diseases classified elsewhere without behavioral disturbance: Secondary | ICD-10-CM | POA: Diagnosis not present

## 2020-05-22 DIAGNOSIS — H9190 Unspecified hearing loss, unspecified ear: Secondary | ICD-10-CM | POA: Diagnosis not present

## 2020-05-22 DIAGNOSIS — G309 Alzheimer's disease, unspecified: Secondary | ICD-10-CM | POA: Diagnosis not present

## 2020-05-22 DIAGNOSIS — K219 Gastro-esophageal reflux disease without esophagitis: Secondary | ICD-10-CM | POA: Diagnosis not present

## 2020-05-22 DIAGNOSIS — M5136 Other intervertebral disc degeneration, lumbar region: Secondary | ICD-10-CM | POA: Diagnosis not present

## 2020-05-22 DIAGNOSIS — I10 Essential (primary) hypertension: Secondary | ICD-10-CM | POA: Diagnosis not present

## 2020-05-22 DIAGNOSIS — S0081XD Abrasion of other part of head, subsequent encounter: Secondary | ICD-10-CM | POA: Diagnosis not present

## 2020-05-22 DIAGNOSIS — E785 Hyperlipidemia, unspecified: Secondary | ICD-10-CM | POA: Diagnosis not present

## 2020-05-23 DIAGNOSIS — M5136 Other intervertebral disc degeneration, lumbar region: Secondary | ICD-10-CM | POA: Diagnosis not present

## 2020-05-23 DIAGNOSIS — I10 Essential (primary) hypertension: Secondary | ICD-10-CM | POA: Diagnosis not present

## 2020-05-23 DIAGNOSIS — E785 Hyperlipidemia, unspecified: Secondary | ICD-10-CM | POA: Diagnosis not present

## 2020-05-23 DIAGNOSIS — F0280 Dementia in other diseases classified elsewhere without behavioral disturbance: Secondary | ICD-10-CM | POA: Diagnosis not present

## 2020-05-23 DIAGNOSIS — H9190 Unspecified hearing loss, unspecified ear: Secondary | ICD-10-CM | POA: Diagnosis not present

## 2020-05-23 DIAGNOSIS — K219 Gastro-esophageal reflux disease without esophagitis: Secondary | ICD-10-CM | POA: Diagnosis not present

## 2020-05-23 DIAGNOSIS — G309 Alzheimer's disease, unspecified: Secondary | ICD-10-CM | POA: Diagnosis not present

## 2020-05-23 DIAGNOSIS — E039 Hypothyroidism, unspecified: Secondary | ICD-10-CM | POA: Diagnosis not present

## 2020-05-23 DIAGNOSIS — S0081XD Abrasion of other part of head, subsequent encounter: Secondary | ICD-10-CM | POA: Diagnosis not present

## 2020-05-30 DIAGNOSIS — S0081XD Abrasion of other part of head, subsequent encounter: Secondary | ICD-10-CM | POA: Diagnosis not present

## 2020-05-30 DIAGNOSIS — G309 Alzheimer's disease, unspecified: Secondary | ICD-10-CM | POA: Diagnosis not present

## 2020-05-30 DIAGNOSIS — M5136 Other intervertebral disc degeneration, lumbar region: Secondary | ICD-10-CM | POA: Diagnosis not present

## 2020-05-30 DIAGNOSIS — I10 Essential (primary) hypertension: Secondary | ICD-10-CM | POA: Diagnosis not present

## 2020-05-30 DIAGNOSIS — E785 Hyperlipidemia, unspecified: Secondary | ICD-10-CM | POA: Diagnosis not present

## 2020-05-30 DIAGNOSIS — K219 Gastro-esophageal reflux disease without esophagitis: Secondary | ICD-10-CM | POA: Diagnosis not present

## 2020-05-30 DIAGNOSIS — F0280 Dementia in other diseases classified elsewhere without behavioral disturbance: Secondary | ICD-10-CM | POA: Diagnosis not present

## 2020-05-30 DIAGNOSIS — E039 Hypothyroidism, unspecified: Secondary | ICD-10-CM | POA: Diagnosis not present

## 2020-05-30 DIAGNOSIS — H9190 Unspecified hearing loss, unspecified ear: Secondary | ICD-10-CM | POA: Diagnosis not present

## 2020-06-01 DIAGNOSIS — S61412A Laceration without foreign body of left hand, initial encounter: Secondary | ICD-10-CM | POA: Diagnosis not present

## 2020-06-01 DIAGNOSIS — S60222A Contusion of left hand, initial encounter: Secondary | ICD-10-CM | POA: Diagnosis not present

## 2020-06-01 DIAGNOSIS — S50812A Abrasion of left forearm, initial encounter: Secondary | ICD-10-CM | POA: Diagnosis not present

## 2020-06-01 DIAGNOSIS — M6281 Muscle weakness (generalized): Secondary | ICD-10-CM | POA: Diagnosis not present

## 2020-06-01 DIAGNOSIS — F0390 Unspecified dementia without behavioral disturbance: Secondary | ICD-10-CM | POA: Diagnosis not present

## 2020-06-01 DIAGNOSIS — R269 Unspecified abnormalities of gait and mobility: Secondary | ICD-10-CM | POA: Diagnosis not present

## 2020-06-10 DIAGNOSIS — W19XXXA Unspecified fall, initial encounter: Secondary | ICD-10-CM | POA: Diagnosis not present

## 2020-06-10 DIAGNOSIS — G309 Alzheimer's disease, unspecified: Secondary | ICD-10-CM | POA: Diagnosis not present

## 2020-06-10 DIAGNOSIS — E785 Hyperlipidemia, unspecified: Secondary | ICD-10-CM | POA: Diagnosis not present

## 2020-06-10 DIAGNOSIS — F0280 Dementia in other diseases classified elsewhere without behavioral disturbance: Secondary | ICD-10-CM | POA: Diagnosis not present

## 2020-06-10 DIAGNOSIS — M5136 Other intervertebral disc degeneration, lumbar region: Secondary | ICD-10-CM | POA: Diagnosis not present

## 2020-06-10 DIAGNOSIS — K219 Gastro-esophageal reflux disease without esophagitis: Secondary | ICD-10-CM | POA: Diagnosis not present

## 2020-06-10 DIAGNOSIS — H9190 Unspecified hearing loss, unspecified ear: Secondary | ICD-10-CM | POA: Diagnosis not present

## 2020-06-10 DIAGNOSIS — Z9181 History of falling: Secondary | ICD-10-CM | POA: Diagnosis not present

## 2020-06-10 DIAGNOSIS — M6281 Muscle weakness (generalized): Secondary | ICD-10-CM | POA: Diagnosis not present

## 2020-06-10 DIAGNOSIS — R269 Unspecified abnormalities of gait and mobility: Secondary | ICD-10-CM | POA: Diagnosis not present

## 2020-06-10 DIAGNOSIS — F0390 Unspecified dementia without behavioral disturbance: Secondary | ICD-10-CM | POA: Diagnosis not present

## 2020-06-10 DIAGNOSIS — R2689 Other abnormalities of gait and mobility: Secondary | ICD-10-CM | POA: Diagnosis not present

## 2020-06-10 DIAGNOSIS — S0081XD Abrasion of other part of head, subsequent encounter: Secondary | ICD-10-CM | POA: Diagnosis not present

## 2020-06-10 DIAGNOSIS — E039 Hypothyroidism, unspecified: Secondary | ICD-10-CM | POA: Diagnosis not present

## 2020-06-10 DIAGNOSIS — I10 Essential (primary) hypertension: Secondary | ICD-10-CM | POA: Diagnosis not present

## 2020-06-13 DIAGNOSIS — Z6825 Body mass index (BMI) 25.0-25.9, adult: Secondary | ICD-10-CM | POA: Diagnosis not present

## 2020-06-13 DIAGNOSIS — R634 Abnormal weight loss: Secondary | ICD-10-CM | POA: Diagnosis not present

## 2020-06-13 DIAGNOSIS — F0390 Unspecified dementia without behavioral disturbance: Secondary | ICD-10-CM | POA: Diagnosis not present

## 2020-06-15 DIAGNOSIS — F05 Delirium due to known physiological condition: Secondary | ICD-10-CM | POA: Diagnosis not present

## 2020-06-15 DIAGNOSIS — F5102 Adjustment insomnia: Secondary | ICD-10-CM | POA: Diagnosis not present

## 2020-06-15 DIAGNOSIS — F0391 Unspecified dementia with behavioral disturbance: Secondary | ICD-10-CM | POA: Diagnosis not present

## 2020-06-16 DIAGNOSIS — M1612 Unilateral primary osteoarthritis, left hip: Secondary | ICD-10-CM | POA: Diagnosis not present

## 2020-06-16 DIAGNOSIS — M25552 Pain in left hip: Secondary | ICD-10-CM | POA: Diagnosis not present

## 2020-06-16 DIAGNOSIS — M25562 Pain in left knee: Secondary | ICD-10-CM | POA: Diagnosis not present

## 2020-06-17 DIAGNOSIS — W19XXXA Unspecified fall, initial encounter: Secondary | ICD-10-CM | POA: Diagnosis not present

## 2020-06-17 DIAGNOSIS — W19XXXS Unspecified fall, sequela: Secondary | ICD-10-CM | POA: Diagnosis not present

## 2020-06-17 DIAGNOSIS — Z7409 Other reduced mobility: Secondary | ICD-10-CM | POA: Diagnosis not present

## 2020-06-17 DIAGNOSIS — S0081XD Abrasion of other part of head, subsequent encounter: Secondary | ICD-10-CM | POA: Diagnosis not present

## 2020-06-17 DIAGNOSIS — K219 Gastro-esophageal reflux disease without esophagitis: Secondary | ICD-10-CM | POA: Diagnosis not present

## 2020-06-17 DIAGNOSIS — R102 Pelvic and perineal pain: Secondary | ICD-10-CM | POA: Diagnosis not present

## 2020-06-17 DIAGNOSIS — F0390 Unspecified dementia without behavioral disturbance: Secondary | ICD-10-CM | POA: Diagnosis not present

## 2020-06-17 DIAGNOSIS — M5136 Other intervertebral disc degeneration, lumbar region: Secondary | ICD-10-CM | POA: Diagnosis not present

## 2020-06-17 DIAGNOSIS — G309 Alzheimer's disease, unspecified: Secondary | ICD-10-CM | POA: Diagnosis not present

## 2020-06-17 DIAGNOSIS — R269 Unspecified abnormalities of gait and mobility: Secondary | ICD-10-CM | POA: Diagnosis not present

## 2020-06-17 DIAGNOSIS — E039 Hypothyroidism, unspecified: Secondary | ICD-10-CM | POA: Diagnosis not present

## 2020-06-17 DIAGNOSIS — E785 Hyperlipidemia, unspecified: Secondary | ICD-10-CM | POA: Diagnosis not present

## 2020-06-17 DIAGNOSIS — H9190 Unspecified hearing loss, unspecified ear: Secondary | ICD-10-CM | POA: Diagnosis not present

## 2020-06-17 DIAGNOSIS — M79652 Pain in left thigh: Secondary | ICD-10-CM | POA: Diagnosis not present

## 2020-06-17 DIAGNOSIS — Z9181 History of falling: Secondary | ICD-10-CM | POA: Diagnosis not present

## 2020-06-17 DIAGNOSIS — M79662 Pain in left lower leg: Secondary | ICD-10-CM | POA: Diagnosis not present

## 2020-06-17 DIAGNOSIS — M6281 Muscle weakness (generalized): Secondary | ICD-10-CM | POA: Diagnosis not present

## 2020-06-17 DIAGNOSIS — F0280 Dementia in other diseases classified elsewhere without behavioral disturbance: Secondary | ICD-10-CM | POA: Diagnosis not present

## 2020-06-17 DIAGNOSIS — I10 Essential (primary) hypertension: Secondary | ICD-10-CM | POA: Diagnosis not present

## 2020-06-17 DIAGNOSIS — M79605 Pain in left leg: Secondary | ICD-10-CM | POA: Diagnosis not present

## 2020-06-21 DIAGNOSIS — M5136 Other intervertebral disc degeneration, lumbar region: Secondary | ICD-10-CM | POA: Diagnosis not present

## 2020-06-21 DIAGNOSIS — K219 Gastro-esophageal reflux disease without esophagitis: Secondary | ICD-10-CM | POA: Diagnosis not present

## 2020-06-21 DIAGNOSIS — I10 Essential (primary) hypertension: Secondary | ICD-10-CM | POA: Diagnosis not present

## 2020-06-21 DIAGNOSIS — S0081XD Abrasion of other part of head, subsequent encounter: Secondary | ICD-10-CM | POA: Diagnosis not present

## 2020-06-21 DIAGNOSIS — E039 Hypothyroidism, unspecified: Secondary | ICD-10-CM | POA: Diagnosis not present

## 2020-06-21 DIAGNOSIS — H9190 Unspecified hearing loss, unspecified ear: Secondary | ICD-10-CM | POA: Diagnosis not present

## 2020-06-21 DIAGNOSIS — G309 Alzheimer's disease, unspecified: Secondary | ICD-10-CM | POA: Diagnosis not present

## 2020-06-21 DIAGNOSIS — F0280 Dementia in other diseases classified elsewhere without behavioral disturbance: Secondary | ICD-10-CM | POA: Diagnosis not present

## 2020-06-21 DIAGNOSIS — E785 Hyperlipidemia, unspecified: Secondary | ICD-10-CM | POA: Diagnosis not present

## 2020-06-22 DIAGNOSIS — F0390 Unspecified dementia without behavioral disturbance: Secondary | ICD-10-CM | POA: Diagnosis not present

## 2020-06-22 DIAGNOSIS — R269 Unspecified abnormalities of gait and mobility: Secondary | ICD-10-CM | POA: Diagnosis not present

## 2020-06-22 DIAGNOSIS — M6281 Muscle weakness (generalized): Secondary | ICD-10-CM | POA: Diagnosis not present

## 2020-06-23 DIAGNOSIS — R2689 Other abnormalities of gait and mobility: Secondary | ICD-10-CM | POA: Diagnosis not present

## 2020-06-23 DIAGNOSIS — S0081XA Abrasion of other part of head, initial encounter: Secondary | ICD-10-CM | POA: Diagnosis not present

## 2020-06-23 DIAGNOSIS — R296 Repeated falls: Secondary | ICD-10-CM | POA: Diagnosis not present

## 2020-06-23 DIAGNOSIS — M79652 Pain in left thigh: Secondary | ICD-10-CM | POA: Diagnosis not present

## 2020-06-23 DIAGNOSIS — F0280 Dementia in other diseases classified elsewhere without behavioral disturbance: Secondary | ICD-10-CM | POA: Diagnosis not present

## 2020-06-23 DIAGNOSIS — I639 Cerebral infarction, unspecified: Secondary | ICD-10-CM | POA: Diagnosis not present

## 2020-06-23 DIAGNOSIS — Z951 Presence of aortocoronary bypass graft: Secondary | ICD-10-CM | POA: Diagnosis not present

## 2020-06-23 DIAGNOSIS — R410 Disorientation, unspecified: Secondary | ICD-10-CM | POA: Diagnosis not present

## 2020-06-23 DIAGNOSIS — R69 Illness, unspecified: Secondary | ICD-10-CM | POA: Diagnosis not present

## 2020-06-23 DIAGNOSIS — Z9889 Other specified postprocedural states: Secondary | ICD-10-CM | POA: Diagnosis not present

## 2020-06-23 DIAGNOSIS — R269 Unspecified abnormalities of gait and mobility: Secondary | ICD-10-CM | POA: Diagnosis not present

## 2020-06-23 DIAGNOSIS — M6281 Muscle weakness (generalized): Secondary | ICD-10-CM | POA: Diagnosis not present

## 2020-06-23 DIAGNOSIS — E785 Hyperlipidemia, unspecified: Secondary | ICD-10-CM | POA: Diagnosis not present

## 2020-06-23 DIAGNOSIS — M79605 Pain in left leg: Secondary | ICD-10-CM | POA: Diagnosis not present

## 2020-06-23 DIAGNOSIS — I10 Essential (primary) hypertension: Secondary | ICD-10-CM | POA: Diagnosis not present

## 2020-06-23 DIAGNOSIS — S0990XA Unspecified injury of head, initial encounter: Secondary | ICD-10-CM | POA: Diagnosis not present

## 2020-06-23 DIAGNOSIS — J320 Chronic maxillary sinusitis: Secondary | ICD-10-CM | POA: Diagnosis not present

## 2020-06-23 DIAGNOSIS — J322 Chronic ethmoidal sinusitis: Secondary | ICD-10-CM | POA: Diagnosis not present

## 2020-06-23 DIAGNOSIS — K219 Gastro-esophageal reflux disease without esophagitis: Secondary | ICD-10-CM | POA: Diagnosis not present

## 2020-06-23 DIAGNOSIS — M5136 Other intervertebral disc degeneration, lumbar region: Secondary | ICD-10-CM | POA: Diagnosis not present

## 2020-06-23 DIAGNOSIS — G309 Alzheimer's disease, unspecified: Secondary | ICD-10-CM | POA: Diagnosis not present

## 2020-06-23 DIAGNOSIS — F0390 Unspecified dementia without behavioral disturbance: Secondary | ICD-10-CM | POA: Diagnosis not present

## 2020-06-23 DIAGNOSIS — Z9181 History of falling: Secondary | ICD-10-CM | POA: Diagnosis not present

## 2020-06-23 DIAGNOSIS — H9190 Unspecified hearing loss, unspecified ear: Secondary | ICD-10-CM | POA: Diagnosis not present

## 2020-06-23 DIAGNOSIS — W19XXXA Unspecified fall, initial encounter: Secondary | ICD-10-CM | POA: Diagnosis not present

## 2020-06-23 DIAGNOSIS — E039 Hypothyroidism, unspecified: Secondary | ICD-10-CM | POA: Diagnosis not present

## 2020-06-23 DIAGNOSIS — S0081XD Abrasion of other part of head, subsequent encounter: Secondary | ICD-10-CM | POA: Diagnosis not present

## 2020-06-27 DIAGNOSIS — F0390 Unspecified dementia without behavioral disturbance: Secondary | ICD-10-CM | POA: Diagnosis not present

## 2020-06-27 DIAGNOSIS — S0081XD Abrasion of other part of head, subsequent encounter: Secondary | ICD-10-CM | POA: Diagnosis not present

## 2020-06-27 DIAGNOSIS — I10 Essential (primary) hypertension: Secondary | ICD-10-CM | POA: Diagnosis not present

## 2020-06-27 DIAGNOSIS — R262 Difficulty in walking, not elsewhere classified: Secondary | ICD-10-CM | POA: Diagnosis not present

## 2020-06-27 DIAGNOSIS — H9190 Unspecified hearing loss, unspecified ear: Secondary | ICD-10-CM | POA: Diagnosis not present

## 2020-06-27 DIAGNOSIS — M5136 Other intervertebral disc degeneration, lumbar region: Secondary | ICD-10-CM | POA: Diagnosis not present

## 2020-06-27 DIAGNOSIS — R5381 Other malaise: Secondary | ICD-10-CM | POA: Diagnosis not present

## 2020-06-27 DIAGNOSIS — M6281 Muscle weakness (generalized): Secondary | ICD-10-CM | POA: Diagnosis not present

## 2020-06-27 DIAGNOSIS — Z9181 History of falling: Secondary | ICD-10-CM | POA: Diagnosis not present

## 2020-06-27 DIAGNOSIS — F0280 Dementia in other diseases classified elsewhere without behavioral disturbance: Secondary | ICD-10-CM | POA: Diagnosis not present

## 2020-06-27 DIAGNOSIS — Z8673 Personal history of transient ischemic attack (TIA), and cerebral infarction without residual deficits: Secondary | ICD-10-CM | POA: Diagnosis not present

## 2020-06-27 DIAGNOSIS — K219 Gastro-esophageal reflux disease without esophagitis: Secondary | ICD-10-CM | POA: Diagnosis not present

## 2020-06-27 DIAGNOSIS — E039 Hypothyroidism, unspecified: Secondary | ICD-10-CM | POA: Diagnosis not present

## 2020-06-27 DIAGNOSIS — R269 Unspecified abnormalities of gait and mobility: Secondary | ICD-10-CM | POA: Diagnosis not present

## 2020-06-27 DIAGNOSIS — G309 Alzheimer's disease, unspecified: Secondary | ICD-10-CM | POA: Diagnosis not present

## 2020-06-27 DIAGNOSIS — E785 Hyperlipidemia, unspecified: Secondary | ICD-10-CM | POA: Diagnosis not present

## 2020-06-30 DIAGNOSIS — E039 Hypothyroidism, unspecified: Secondary | ICD-10-CM | POA: Diagnosis not present

## 2020-06-30 DIAGNOSIS — F0280 Dementia in other diseases classified elsewhere without behavioral disturbance: Secondary | ICD-10-CM | POA: Diagnosis not present

## 2020-06-30 DIAGNOSIS — K219 Gastro-esophageal reflux disease without esophagitis: Secondary | ICD-10-CM | POA: Diagnosis not present

## 2020-06-30 DIAGNOSIS — H9190 Unspecified hearing loss, unspecified ear: Secondary | ICD-10-CM | POA: Diagnosis not present

## 2020-06-30 DIAGNOSIS — I10 Essential (primary) hypertension: Secondary | ICD-10-CM | POA: Diagnosis not present

## 2020-06-30 DIAGNOSIS — G309 Alzheimer's disease, unspecified: Secondary | ICD-10-CM | POA: Diagnosis not present

## 2020-06-30 DIAGNOSIS — E785 Hyperlipidemia, unspecified: Secondary | ICD-10-CM | POA: Diagnosis not present

## 2020-06-30 DIAGNOSIS — M5136 Other intervertebral disc degeneration, lumbar region: Secondary | ICD-10-CM | POA: Diagnosis not present

## 2020-06-30 DIAGNOSIS — S0081XD Abrasion of other part of head, subsequent encounter: Secondary | ICD-10-CM | POA: Diagnosis not present

## 2020-07-04 DIAGNOSIS — G309 Alzheimer's disease, unspecified: Secondary | ICD-10-CM | POA: Diagnosis not present

## 2020-07-04 DIAGNOSIS — W19XXXA Unspecified fall, initial encounter: Secondary | ICD-10-CM | POA: Diagnosis not present

## 2020-07-04 DIAGNOSIS — Z9181 History of falling: Secondary | ICD-10-CM | POA: Diagnosis not present

## 2020-07-04 DIAGNOSIS — I10 Essential (primary) hypertension: Secondary | ICD-10-CM | POA: Diagnosis not present

## 2020-07-04 DIAGNOSIS — M5136 Other intervertebral disc degeneration, lumbar region: Secondary | ICD-10-CM | POA: Diagnosis not present

## 2020-07-04 DIAGNOSIS — F0390 Unspecified dementia without behavioral disturbance: Secondary | ICD-10-CM | POA: Diagnosis not present

## 2020-07-04 DIAGNOSIS — R2689 Other abnormalities of gait and mobility: Secondary | ICD-10-CM | POA: Diagnosis not present

## 2020-07-04 DIAGNOSIS — R269 Unspecified abnormalities of gait and mobility: Secondary | ICD-10-CM | POA: Diagnosis not present

## 2020-07-04 DIAGNOSIS — K219 Gastro-esophageal reflux disease without esophagitis: Secondary | ICD-10-CM | POA: Diagnosis not present

## 2020-07-04 DIAGNOSIS — E785 Hyperlipidemia, unspecified: Secondary | ICD-10-CM | POA: Diagnosis not present

## 2020-07-04 DIAGNOSIS — F0280 Dementia in other diseases classified elsewhere without behavioral disturbance: Secondary | ICD-10-CM | POA: Diagnosis not present

## 2020-07-04 DIAGNOSIS — S0081XD Abrasion of other part of head, subsequent encounter: Secondary | ICD-10-CM | POA: Diagnosis not present

## 2020-07-04 DIAGNOSIS — H9190 Unspecified hearing loss, unspecified ear: Secondary | ICD-10-CM | POA: Diagnosis not present

## 2020-07-04 DIAGNOSIS — E039 Hypothyroidism, unspecified: Secondary | ICD-10-CM | POA: Diagnosis not present

## 2020-07-04 DIAGNOSIS — M6281 Muscle weakness (generalized): Secondary | ICD-10-CM | POA: Diagnosis not present

## 2020-07-05 DIAGNOSIS — G309 Alzheimer's disease, unspecified: Secondary | ICD-10-CM | POA: Diagnosis not present

## 2020-07-05 DIAGNOSIS — S0081XD Abrasion of other part of head, subsequent encounter: Secondary | ICD-10-CM | POA: Diagnosis not present

## 2020-07-05 DIAGNOSIS — M5136 Other intervertebral disc degeneration, lumbar region: Secondary | ICD-10-CM | POA: Diagnosis not present

## 2020-07-05 DIAGNOSIS — K219 Gastro-esophageal reflux disease without esophagitis: Secondary | ICD-10-CM | POA: Diagnosis not present

## 2020-07-05 DIAGNOSIS — I10 Essential (primary) hypertension: Secondary | ICD-10-CM | POA: Diagnosis not present

## 2020-07-05 DIAGNOSIS — H9190 Unspecified hearing loss, unspecified ear: Secondary | ICD-10-CM | POA: Diagnosis not present

## 2020-07-05 DIAGNOSIS — E785 Hyperlipidemia, unspecified: Secondary | ICD-10-CM | POA: Diagnosis not present

## 2020-07-05 DIAGNOSIS — F0280 Dementia in other diseases classified elsewhere without behavioral disturbance: Secondary | ICD-10-CM | POA: Diagnosis not present

## 2020-07-05 DIAGNOSIS — E039 Hypothyroidism, unspecified: Secondary | ICD-10-CM | POA: Diagnosis not present

## 2020-07-05 DIAGNOSIS — Z20828 Contact with and (suspected) exposure to other viral communicable diseases: Secondary | ICD-10-CM | POA: Diagnosis not present

## 2020-07-11 DIAGNOSIS — E785 Hyperlipidemia, unspecified: Secondary | ICD-10-CM | POA: Diagnosis not present

## 2020-07-11 DIAGNOSIS — M5136 Other intervertebral disc degeneration, lumbar region: Secondary | ICD-10-CM | POA: Diagnosis not present

## 2020-07-11 DIAGNOSIS — G309 Alzheimer's disease, unspecified: Secondary | ICD-10-CM | POA: Diagnosis not present

## 2020-07-11 DIAGNOSIS — F0280 Dementia in other diseases classified elsewhere without behavioral disturbance: Secondary | ICD-10-CM | POA: Diagnosis not present

## 2020-07-11 DIAGNOSIS — E039 Hypothyroidism, unspecified: Secondary | ICD-10-CM | POA: Diagnosis not present

## 2020-07-11 DIAGNOSIS — K219 Gastro-esophageal reflux disease without esophagitis: Secondary | ICD-10-CM | POA: Diagnosis not present

## 2020-07-11 DIAGNOSIS — S0081XD Abrasion of other part of head, subsequent encounter: Secondary | ICD-10-CM | POA: Diagnosis not present

## 2020-07-11 DIAGNOSIS — H9190 Unspecified hearing loss, unspecified ear: Secondary | ICD-10-CM | POA: Diagnosis not present

## 2020-07-11 DIAGNOSIS — Z20828 Contact with and (suspected) exposure to other viral communicable diseases: Secondary | ICD-10-CM | POA: Diagnosis not present

## 2020-07-11 DIAGNOSIS — I10 Essential (primary) hypertension: Secondary | ICD-10-CM | POA: Diagnosis not present

## 2020-07-12 DIAGNOSIS — E785 Hyperlipidemia, unspecified: Secondary | ICD-10-CM | POA: Diagnosis not present

## 2020-07-12 DIAGNOSIS — I10 Essential (primary) hypertension: Secondary | ICD-10-CM | POA: Diagnosis not present

## 2020-07-12 DIAGNOSIS — S0081XD Abrasion of other part of head, subsequent encounter: Secondary | ICD-10-CM | POA: Diagnosis not present

## 2020-07-12 DIAGNOSIS — H9190 Unspecified hearing loss, unspecified ear: Secondary | ICD-10-CM | POA: Diagnosis not present

## 2020-07-12 DIAGNOSIS — E039 Hypothyroidism, unspecified: Secondary | ICD-10-CM | POA: Diagnosis not present

## 2020-07-12 DIAGNOSIS — G309 Alzheimer's disease, unspecified: Secondary | ICD-10-CM | POA: Diagnosis not present

## 2020-07-12 DIAGNOSIS — F0280 Dementia in other diseases classified elsewhere without behavioral disturbance: Secondary | ICD-10-CM | POA: Diagnosis not present

## 2020-07-12 DIAGNOSIS — M5136 Other intervertebral disc degeneration, lumbar region: Secondary | ICD-10-CM | POA: Diagnosis not present

## 2020-07-12 DIAGNOSIS — K219 Gastro-esophageal reflux disease without esophagitis: Secondary | ICD-10-CM | POA: Diagnosis not present

## 2020-07-13 DIAGNOSIS — F5102 Adjustment insomnia: Secondary | ICD-10-CM | POA: Diagnosis not present

## 2020-07-13 DIAGNOSIS — F05 Delirium due to known physiological condition: Secondary | ICD-10-CM | POA: Diagnosis not present

## 2020-07-13 DIAGNOSIS — F0391 Unspecified dementia with behavioral disturbance: Secondary | ICD-10-CM | POA: Diagnosis not present

## 2020-07-18 DIAGNOSIS — Z20828 Contact with and (suspected) exposure to other viral communicable diseases: Secondary | ICD-10-CM | POA: Diagnosis not present

## 2020-07-21 DIAGNOSIS — E785 Hyperlipidemia, unspecified: Secondary | ICD-10-CM | POA: Diagnosis not present

## 2020-07-21 DIAGNOSIS — E039 Hypothyroidism, unspecified: Secondary | ICD-10-CM | POA: Diagnosis not present

## 2020-07-21 DIAGNOSIS — F0280 Dementia in other diseases classified elsewhere without behavioral disturbance: Secondary | ICD-10-CM | POA: Diagnosis not present

## 2020-07-21 DIAGNOSIS — I10 Essential (primary) hypertension: Secondary | ICD-10-CM | POA: Diagnosis not present

## 2020-07-21 DIAGNOSIS — K219 Gastro-esophageal reflux disease without esophagitis: Secondary | ICD-10-CM | POA: Diagnosis not present

## 2020-07-21 DIAGNOSIS — H9190 Unspecified hearing loss, unspecified ear: Secondary | ICD-10-CM | POA: Diagnosis not present

## 2020-07-21 DIAGNOSIS — M5136 Other intervertebral disc degeneration, lumbar region: Secondary | ICD-10-CM | POA: Diagnosis not present

## 2020-07-21 DIAGNOSIS — G309 Alzheimer's disease, unspecified: Secondary | ICD-10-CM | POA: Diagnosis not present

## 2020-07-21 DIAGNOSIS — S0081XD Abrasion of other part of head, subsequent encounter: Secondary | ICD-10-CM | POA: Diagnosis not present

## 2020-07-22 DIAGNOSIS — H9190 Unspecified hearing loss, unspecified ear: Secondary | ICD-10-CM | POA: Diagnosis not present

## 2020-07-22 DIAGNOSIS — E039 Hypothyroidism, unspecified: Secondary | ICD-10-CM | POA: Diagnosis not present

## 2020-07-22 DIAGNOSIS — I10 Essential (primary) hypertension: Secondary | ICD-10-CM | POA: Diagnosis not present

## 2020-07-22 DIAGNOSIS — G309 Alzheimer's disease, unspecified: Secondary | ICD-10-CM | POA: Diagnosis not present

## 2020-07-22 DIAGNOSIS — F0280 Dementia in other diseases classified elsewhere without behavioral disturbance: Secondary | ICD-10-CM | POA: Diagnosis not present

## 2020-07-22 DIAGNOSIS — E785 Hyperlipidemia, unspecified: Secondary | ICD-10-CM | POA: Diagnosis not present

## 2020-07-22 DIAGNOSIS — M5136 Other intervertebral disc degeneration, lumbar region: Secondary | ICD-10-CM | POA: Diagnosis not present

## 2020-07-22 DIAGNOSIS — S0081XD Abrasion of other part of head, subsequent encounter: Secondary | ICD-10-CM | POA: Diagnosis not present

## 2020-07-22 DIAGNOSIS — K219 Gastro-esophageal reflux disease without esophagitis: Secondary | ICD-10-CM | POA: Diagnosis not present

## 2020-07-25 DIAGNOSIS — Z20828 Contact with and (suspected) exposure to other viral communicable diseases: Secondary | ICD-10-CM | POA: Diagnosis not present

## 2020-07-26 DIAGNOSIS — S0081XD Abrasion of other part of head, subsequent encounter: Secondary | ICD-10-CM | POA: Diagnosis not present

## 2020-07-26 DIAGNOSIS — E039 Hypothyroidism, unspecified: Secondary | ICD-10-CM | POA: Diagnosis not present

## 2020-07-26 DIAGNOSIS — K219 Gastro-esophageal reflux disease without esophagitis: Secondary | ICD-10-CM | POA: Diagnosis not present

## 2020-07-26 DIAGNOSIS — I10 Essential (primary) hypertension: Secondary | ICD-10-CM | POA: Diagnosis not present

## 2020-07-26 DIAGNOSIS — E785 Hyperlipidemia, unspecified: Secondary | ICD-10-CM | POA: Diagnosis not present

## 2020-07-26 DIAGNOSIS — M5136 Other intervertebral disc degeneration, lumbar region: Secondary | ICD-10-CM | POA: Diagnosis not present

## 2020-07-26 DIAGNOSIS — G309 Alzheimer's disease, unspecified: Secondary | ICD-10-CM | POA: Diagnosis not present

## 2020-07-26 DIAGNOSIS — F0280 Dementia in other diseases classified elsewhere without behavioral disturbance: Secondary | ICD-10-CM | POA: Diagnosis not present

## 2020-07-26 DIAGNOSIS — H9190 Unspecified hearing loss, unspecified ear: Secondary | ICD-10-CM | POA: Diagnosis not present

## 2020-07-27 DIAGNOSIS — R269 Unspecified abnormalities of gait and mobility: Secondary | ICD-10-CM | POA: Diagnosis not present

## 2020-07-27 DIAGNOSIS — F0390 Unspecified dementia without behavioral disturbance: Secondary | ICD-10-CM | POA: Diagnosis not present

## 2020-07-27 DIAGNOSIS — Z9181 History of falling: Secondary | ICD-10-CM | POA: Diagnosis not present

## 2020-07-27 DIAGNOSIS — M6281 Muscle weakness (generalized): Secondary | ICD-10-CM | POA: Diagnosis not present

## 2020-07-27 DIAGNOSIS — R2689 Other abnormalities of gait and mobility: Secondary | ICD-10-CM | POA: Diagnosis not present

## 2020-07-27 DIAGNOSIS — R54 Age-related physical debility: Secondary | ICD-10-CM | POA: Diagnosis not present

## 2020-07-27 DIAGNOSIS — G309 Alzheimer's disease, unspecified: Secondary | ICD-10-CM | POA: Diagnosis not present

## 2020-07-27 DIAGNOSIS — W19XXXA Unspecified fall, initial encounter: Secondary | ICD-10-CM | POA: Diagnosis not present

## 2020-08-01 DIAGNOSIS — Z20828 Contact with and (suspected) exposure to other viral communicable diseases: Secondary | ICD-10-CM | POA: Diagnosis not present

## 2020-08-09 DIAGNOSIS — Z20828 Contact with and (suspected) exposure to other viral communicable diseases: Secondary | ICD-10-CM | POA: Diagnosis not present

## 2020-08-10 DIAGNOSIS — F05 Delirium due to known physiological condition: Secondary | ICD-10-CM | POA: Diagnosis not present

## 2020-08-10 DIAGNOSIS — F5102 Adjustment insomnia: Secondary | ICD-10-CM | POA: Diagnosis not present

## 2020-08-10 DIAGNOSIS — F0391 Unspecified dementia with behavioral disturbance: Secondary | ICD-10-CM | POA: Diagnosis not present

## 2020-08-16 DIAGNOSIS — Z20828 Contact with and (suspected) exposure to other viral communicable diseases: Secondary | ICD-10-CM | POA: Diagnosis not present

## 2020-08-17 DIAGNOSIS — W19XXXA Unspecified fall, initial encounter: Secondary | ICD-10-CM | POA: Diagnosis not present

## 2020-08-17 DIAGNOSIS — M6281 Muscle weakness (generalized): Secondary | ICD-10-CM | POA: Diagnosis not present

## 2020-08-17 DIAGNOSIS — Z9183 Wandering in diseases classified elsewhere: Secondary | ICD-10-CM | POA: Diagnosis not present

## 2020-08-17 DIAGNOSIS — R296 Repeated falls: Secondary | ICD-10-CM | POA: Diagnosis not present

## 2020-08-17 DIAGNOSIS — F0390 Unspecified dementia without behavioral disturbance: Secondary | ICD-10-CM | POA: Diagnosis not present

## 2020-08-17 DIAGNOSIS — Z9181 History of falling: Secondary | ICD-10-CM | POA: Diagnosis not present

## 2020-08-17 DIAGNOSIS — R269 Unspecified abnormalities of gait and mobility: Secondary | ICD-10-CM | POA: Diagnosis not present

## 2020-08-22 DIAGNOSIS — R296 Repeated falls: Secondary | ICD-10-CM | POA: Diagnosis not present

## 2020-08-22 DIAGNOSIS — Z9183 Wandering in diseases classified elsewhere: Secondary | ICD-10-CM | POA: Diagnosis not present

## 2020-08-22 DIAGNOSIS — N390 Urinary tract infection, site not specified: Secondary | ICD-10-CM | POA: Diagnosis not present

## 2020-08-22 DIAGNOSIS — W19XXXA Unspecified fall, initial encounter: Secondary | ICD-10-CM | POA: Diagnosis not present

## 2020-08-22 DIAGNOSIS — Z7902 Long term (current) use of antithrombotics/antiplatelets: Secondary | ICD-10-CM | POA: Diagnosis not present

## 2020-08-22 DIAGNOSIS — M5136 Other intervertebral disc degeneration, lumbar region: Secondary | ICD-10-CM | POA: Diagnosis not present

## 2020-08-22 DIAGNOSIS — I10 Essential (primary) hypertension: Secondary | ICD-10-CM | POA: Diagnosis not present

## 2020-08-22 DIAGNOSIS — G309 Alzheimer's disease, unspecified: Secondary | ICD-10-CM | POA: Diagnosis not present

## 2020-08-22 DIAGNOSIS — R2689 Other abnormalities of gait and mobility: Secondary | ICD-10-CM | POA: Diagnosis not present

## 2020-08-22 DIAGNOSIS — R269 Unspecified abnormalities of gait and mobility: Secondary | ICD-10-CM | POA: Diagnosis not present

## 2020-08-22 DIAGNOSIS — F0390 Unspecified dementia without behavioral disturbance: Secondary | ICD-10-CM | POA: Diagnosis not present

## 2020-08-22 DIAGNOSIS — M6281 Muscle weakness (generalized): Secondary | ICD-10-CM | POA: Diagnosis not present

## 2020-08-22 DIAGNOSIS — E785 Hyperlipidemia, unspecified: Secondary | ICD-10-CM | POA: Diagnosis not present

## 2020-08-22 DIAGNOSIS — F0281 Dementia in other diseases classified elsewhere with behavioral disturbance: Secondary | ICD-10-CM | POA: Diagnosis not present

## 2020-08-22 DIAGNOSIS — Z9181 History of falling: Secondary | ICD-10-CM | POA: Diagnosis not present

## 2020-08-22 DIAGNOSIS — K219 Gastro-esophageal reflux disease without esophagitis: Secondary | ICD-10-CM | POA: Diagnosis not present

## 2020-08-22 DIAGNOSIS — E039 Hypothyroidism, unspecified: Secondary | ICD-10-CM | POA: Diagnosis not present

## 2020-08-22 DIAGNOSIS — R54 Age-related physical debility: Secondary | ICD-10-CM | POA: Diagnosis not present

## 2020-08-24 DIAGNOSIS — R197 Diarrhea, unspecified: Secondary | ICD-10-CM | POA: Diagnosis not present

## 2020-08-24 DIAGNOSIS — R54 Age-related physical debility: Secondary | ICD-10-CM | POA: Diagnosis not present

## 2020-08-24 DIAGNOSIS — F0390 Unspecified dementia without behavioral disturbance: Secondary | ICD-10-CM | POA: Diagnosis not present

## 2020-08-27 DIAGNOSIS — G309 Alzheimer's disease, unspecified: Secondary | ICD-10-CM | POA: Diagnosis not present

## 2020-08-30 DIAGNOSIS — G309 Alzheimer's disease, unspecified: Secondary | ICD-10-CM | POA: Diagnosis not present

## 2020-08-30 DIAGNOSIS — E039 Hypothyroidism, unspecified: Secondary | ICD-10-CM | POA: Diagnosis not present

## 2020-08-30 DIAGNOSIS — Z9183 Wandering in diseases classified elsewhere: Secondary | ICD-10-CM | POA: Diagnosis not present

## 2020-08-30 DIAGNOSIS — M5136 Other intervertebral disc degeneration, lumbar region: Secondary | ICD-10-CM | POA: Diagnosis not present

## 2020-08-30 DIAGNOSIS — I10 Essential (primary) hypertension: Secondary | ICD-10-CM | POA: Diagnosis not present

## 2020-08-30 DIAGNOSIS — K219 Gastro-esophageal reflux disease without esophagitis: Secondary | ICD-10-CM | POA: Diagnosis not present

## 2020-08-30 DIAGNOSIS — E785 Hyperlipidemia, unspecified: Secondary | ICD-10-CM | POA: Diagnosis not present

## 2020-08-30 DIAGNOSIS — Z7902 Long term (current) use of antithrombotics/antiplatelets: Secondary | ICD-10-CM | POA: Diagnosis not present

## 2020-08-30 DIAGNOSIS — F0281 Dementia in other diseases classified elsewhere with behavioral disturbance: Secondary | ICD-10-CM | POA: Diagnosis not present

## 2020-09-01 DIAGNOSIS — F0281 Dementia in other diseases classified elsewhere with behavioral disturbance: Secondary | ICD-10-CM | POA: Diagnosis not present

## 2020-09-01 DIAGNOSIS — Z7902 Long term (current) use of antithrombotics/antiplatelets: Secondary | ICD-10-CM | POA: Diagnosis not present

## 2020-09-01 DIAGNOSIS — G309 Alzheimer's disease, unspecified: Secondary | ICD-10-CM | POA: Diagnosis not present

## 2020-09-01 DIAGNOSIS — Z9183 Wandering in diseases classified elsewhere: Secondary | ICD-10-CM | POA: Diagnosis not present

## 2020-09-01 DIAGNOSIS — M5136 Other intervertebral disc degeneration, lumbar region: Secondary | ICD-10-CM | POA: Diagnosis not present

## 2020-09-01 DIAGNOSIS — I10 Essential (primary) hypertension: Secondary | ICD-10-CM | POA: Diagnosis not present

## 2020-09-01 DIAGNOSIS — E039 Hypothyroidism, unspecified: Secondary | ICD-10-CM | POA: Diagnosis not present

## 2020-09-01 DIAGNOSIS — E785 Hyperlipidemia, unspecified: Secondary | ICD-10-CM | POA: Diagnosis not present

## 2020-09-01 DIAGNOSIS — K219 Gastro-esophageal reflux disease without esophagitis: Secondary | ICD-10-CM | POA: Diagnosis not present

## 2020-09-02 DIAGNOSIS — R2689 Other abnormalities of gait and mobility: Secondary | ICD-10-CM | POA: Diagnosis not present

## 2020-09-02 DIAGNOSIS — F0390 Unspecified dementia without behavioral disturbance: Secondary | ICD-10-CM | POA: Diagnosis not present

## 2020-09-02 DIAGNOSIS — R54 Age-related physical debility: Secondary | ICD-10-CM | POA: Diagnosis not present

## 2020-09-02 DIAGNOSIS — R269 Unspecified abnormalities of gait and mobility: Secondary | ICD-10-CM | POA: Diagnosis not present

## 2020-09-02 DIAGNOSIS — S80811A Abrasion, right lower leg, initial encounter: Secondary | ICD-10-CM | POA: Diagnosis not present

## 2020-09-02 DIAGNOSIS — W19XXXA Unspecified fall, initial encounter: Secondary | ICD-10-CM | POA: Diagnosis not present

## 2020-09-02 DIAGNOSIS — M6281 Muscle weakness (generalized): Secondary | ICD-10-CM | POA: Diagnosis not present

## 2020-09-02 DIAGNOSIS — Z9181 History of falling: Secondary | ICD-10-CM | POA: Diagnosis not present

## 2020-09-05 DIAGNOSIS — G309 Alzheimer's disease, unspecified: Secondary | ICD-10-CM | POA: Diagnosis not present

## 2020-09-05 DIAGNOSIS — M5136 Other intervertebral disc degeneration, lumbar region: Secondary | ICD-10-CM | POA: Diagnosis not present

## 2020-09-05 DIAGNOSIS — Z7902 Long term (current) use of antithrombotics/antiplatelets: Secondary | ICD-10-CM | POA: Diagnosis not present

## 2020-09-05 DIAGNOSIS — I10 Essential (primary) hypertension: Secondary | ICD-10-CM | POA: Diagnosis not present

## 2020-09-05 DIAGNOSIS — E039 Hypothyroidism, unspecified: Secondary | ICD-10-CM | POA: Diagnosis not present

## 2020-09-05 DIAGNOSIS — K219 Gastro-esophageal reflux disease without esophagitis: Secondary | ICD-10-CM | POA: Diagnosis not present

## 2020-09-05 DIAGNOSIS — F0281 Dementia in other diseases classified elsewhere with behavioral disturbance: Secondary | ICD-10-CM | POA: Diagnosis not present

## 2020-09-05 DIAGNOSIS — E785 Hyperlipidemia, unspecified: Secondary | ICD-10-CM | POA: Diagnosis not present

## 2020-09-05 DIAGNOSIS — Z9183 Wandering in diseases classified elsewhere: Secondary | ICD-10-CM | POA: Diagnosis not present

## 2020-09-06 DIAGNOSIS — Z9183 Wandering in diseases classified elsewhere: Secondary | ICD-10-CM | POA: Diagnosis not present

## 2020-09-06 DIAGNOSIS — M5136 Other intervertebral disc degeneration, lumbar region: Secondary | ICD-10-CM | POA: Diagnosis not present

## 2020-09-06 DIAGNOSIS — Z7902 Long term (current) use of antithrombotics/antiplatelets: Secondary | ICD-10-CM | POA: Diagnosis not present

## 2020-09-06 DIAGNOSIS — K219 Gastro-esophageal reflux disease without esophagitis: Secondary | ICD-10-CM | POA: Diagnosis not present

## 2020-09-06 DIAGNOSIS — E785 Hyperlipidemia, unspecified: Secondary | ICD-10-CM | POA: Diagnosis not present

## 2020-09-06 DIAGNOSIS — F0281 Dementia in other diseases classified elsewhere with behavioral disturbance: Secondary | ICD-10-CM | POA: Diagnosis not present

## 2020-09-06 DIAGNOSIS — I10 Essential (primary) hypertension: Secondary | ICD-10-CM | POA: Diagnosis not present

## 2020-09-06 DIAGNOSIS — G309 Alzheimer's disease, unspecified: Secondary | ICD-10-CM | POA: Diagnosis not present

## 2020-09-06 DIAGNOSIS — E039 Hypothyroidism, unspecified: Secondary | ICD-10-CM | POA: Diagnosis not present

## 2020-09-14 DIAGNOSIS — F05 Delirium due to known physiological condition: Secondary | ICD-10-CM | POA: Diagnosis not present

## 2020-09-14 DIAGNOSIS — F0391 Unspecified dementia with behavioral disturbance: Secondary | ICD-10-CM | POA: Diagnosis not present

## 2020-09-14 DIAGNOSIS — F5102 Adjustment insomnia: Secondary | ICD-10-CM | POA: Diagnosis not present

## 2020-09-15 DIAGNOSIS — Z7902 Long term (current) use of antithrombotics/antiplatelets: Secondary | ICD-10-CM | POA: Diagnosis not present

## 2020-09-15 DIAGNOSIS — F0281 Dementia in other diseases classified elsewhere with behavioral disturbance: Secondary | ICD-10-CM | POA: Diagnosis not present

## 2020-09-15 DIAGNOSIS — Z9183 Wandering in diseases classified elsewhere: Secondary | ICD-10-CM | POA: Diagnosis not present

## 2020-09-15 DIAGNOSIS — K219 Gastro-esophageal reflux disease without esophagitis: Secondary | ICD-10-CM | POA: Diagnosis not present

## 2020-09-15 DIAGNOSIS — G309 Alzheimer's disease, unspecified: Secondary | ICD-10-CM | POA: Diagnosis not present

## 2020-09-15 DIAGNOSIS — M5136 Other intervertebral disc degeneration, lumbar region: Secondary | ICD-10-CM | POA: Diagnosis not present

## 2020-09-15 DIAGNOSIS — E039 Hypothyroidism, unspecified: Secondary | ICD-10-CM | POA: Diagnosis not present

## 2020-09-15 DIAGNOSIS — E785 Hyperlipidemia, unspecified: Secondary | ICD-10-CM | POA: Diagnosis not present

## 2020-09-15 DIAGNOSIS — I10 Essential (primary) hypertension: Secondary | ICD-10-CM | POA: Diagnosis not present

## 2020-09-19 DIAGNOSIS — K219 Gastro-esophageal reflux disease without esophagitis: Secondary | ICD-10-CM | POA: Diagnosis not present

## 2020-09-19 DIAGNOSIS — E039 Hypothyroidism, unspecified: Secondary | ICD-10-CM | POA: Diagnosis not present

## 2020-09-19 DIAGNOSIS — F0281 Dementia in other diseases classified elsewhere with behavioral disturbance: Secondary | ICD-10-CM | POA: Diagnosis not present

## 2020-09-19 DIAGNOSIS — I10 Essential (primary) hypertension: Secondary | ICD-10-CM | POA: Diagnosis not present

## 2020-09-19 DIAGNOSIS — Z7902 Long term (current) use of antithrombotics/antiplatelets: Secondary | ICD-10-CM | POA: Diagnosis not present

## 2020-09-19 DIAGNOSIS — G309 Alzheimer's disease, unspecified: Secondary | ICD-10-CM | POA: Diagnosis not present

## 2020-09-19 DIAGNOSIS — Z9183 Wandering in diseases classified elsewhere: Secondary | ICD-10-CM | POA: Diagnosis not present

## 2020-09-19 DIAGNOSIS — M5136 Other intervertebral disc degeneration, lumbar region: Secondary | ICD-10-CM | POA: Diagnosis not present

## 2020-09-19 DIAGNOSIS — E785 Hyperlipidemia, unspecified: Secondary | ICD-10-CM | POA: Diagnosis not present

## 2020-09-21 DIAGNOSIS — E039 Hypothyroidism, unspecified: Secondary | ICD-10-CM | POA: Diagnosis not present

## 2020-09-21 DIAGNOSIS — M5136 Other intervertebral disc degeneration, lumbar region: Secondary | ICD-10-CM | POA: Diagnosis not present

## 2020-09-21 DIAGNOSIS — Z8673 Personal history of transient ischemic attack (TIA), and cerebral infarction without residual deficits: Secondary | ICD-10-CM | POA: Diagnosis not present

## 2020-09-21 DIAGNOSIS — I10 Essential (primary) hypertension: Secondary | ICD-10-CM | POA: Diagnosis not present

## 2020-09-21 DIAGNOSIS — Z7902 Long term (current) use of antithrombotics/antiplatelets: Secondary | ICD-10-CM | POA: Diagnosis not present

## 2020-09-21 DIAGNOSIS — Z9183 Wandering in diseases classified elsewhere: Secondary | ICD-10-CM | POA: Diagnosis not present

## 2020-09-21 DIAGNOSIS — F0281 Dementia in other diseases classified elsewhere with behavioral disturbance: Secondary | ICD-10-CM | POA: Diagnosis not present

## 2020-09-21 DIAGNOSIS — K219 Gastro-esophageal reflux disease without esophagitis: Secondary | ICD-10-CM | POA: Diagnosis not present

## 2020-09-21 DIAGNOSIS — G309 Alzheimer's disease, unspecified: Secondary | ICD-10-CM | POA: Diagnosis not present

## 2020-09-22 DIAGNOSIS — R52 Pain, unspecified: Secondary | ICD-10-CM | POA: Diagnosis not present

## 2020-09-22 DIAGNOSIS — F0390 Unspecified dementia without behavioral disturbance: Secondary | ICD-10-CM | POA: Diagnosis not present

## 2020-09-22 DIAGNOSIS — R54 Age-related physical debility: Secondary | ICD-10-CM | POA: Diagnosis not present

## 2020-09-26 DIAGNOSIS — G309 Alzheimer's disease, unspecified: Secondary | ICD-10-CM | POA: Diagnosis not present

## 2020-09-30 DIAGNOSIS — G309 Alzheimer's disease, unspecified: Secondary | ICD-10-CM | POA: Diagnosis not present

## 2020-09-30 DIAGNOSIS — Z8673 Personal history of transient ischemic attack (TIA), and cerebral infarction without residual deficits: Secondary | ICD-10-CM | POA: Diagnosis not present

## 2020-09-30 DIAGNOSIS — Z7902 Long term (current) use of antithrombotics/antiplatelets: Secondary | ICD-10-CM | POA: Diagnosis not present

## 2020-09-30 DIAGNOSIS — E039 Hypothyroidism, unspecified: Secondary | ICD-10-CM | POA: Diagnosis not present

## 2020-09-30 DIAGNOSIS — F0281 Dementia in other diseases classified elsewhere with behavioral disturbance: Secondary | ICD-10-CM | POA: Diagnosis not present

## 2020-09-30 DIAGNOSIS — Z9183 Wandering in diseases classified elsewhere: Secondary | ICD-10-CM | POA: Diagnosis not present

## 2020-09-30 DIAGNOSIS — M5136 Other intervertebral disc degeneration, lumbar region: Secondary | ICD-10-CM | POA: Diagnosis not present

## 2020-09-30 DIAGNOSIS — I10 Essential (primary) hypertension: Secondary | ICD-10-CM | POA: Diagnosis not present

## 2020-09-30 DIAGNOSIS — K219 Gastro-esophageal reflux disease without esophagitis: Secondary | ICD-10-CM | POA: Diagnosis not present

## 2020-10-06 DIAGNOSIS — E039 Hypothyroidism, unspecified: Secondary | ICD-10-CM | POA: Diagnosis not present

## 2020-10-06 DIAGNOSIS — Z7902 Long term (current) use of antithrombotics/antiplatelets: Secondary | ICD-10-CM | POA: Diagnosis not present

## 2020-10-06 DIAGNOSIS — M5136 Other intervertebral disc degeneration, lumbar region: Secondary | ICD-10-CM | POA: Diagnosis not present

## 2020-10-06 DIAGNOSIS — Z9183 Wandering in diseases classified elsewhere: Secondary | ICD-10-CM | POA: Diagnosis not present

## 2020-10-06 DIAGNOSIS — F0281 Dementia in other diseases classified elsewhere with behavioral disturbance: Secondary | ICD-10-CM | POA: Diagnosis not present

## 2020-10-06 DIAGNOSIS — G309 Alzheimer's disease, unspecified: Secondary | ICD-10-CM | POA: Diagnosis not present

## 2020-10-06 DIAGNOSIS — K219 Gastro-esophageal reflux disease without esophagitis: Secondary | ICD-10-CM | POA: Diagnosis not present

## 2020-10-06 DIAGNOSIS — Z8673 Personal history of transient ischemic attack (TIA), and cerebral infarction without residual deficits: Secondary | ICD-10-CM | POA: Diagnosis not present

## 2020-10-06 DIAGNOSIS — I10 Essential (primary) hypertension: Secondary | ICD-10-CM | POA: Diagnosis not present

## 2020-10-07 DIAGNOSIS — K219 Gastro-esophageal reflux disease without esophagitis: Secondary | ICD-10-CM | POA: Diagnosis not present

## 2020-10-07 DIAGNOSIS — I10 Essential (primary) hypertension: Secondary | ICD-10-CM | POA: Diagnosis not present

## 2020-10-07 DIAGNOSIS — M546 Pain in thoracic spine: Secondary | ICD-10-CM | POA: Diagnosis not present

## 2020-10-07 DIAGNOSIS — I252 Old myocardial infarction: Secondary | ICD-10-CM | POA: Diagnosis not present

## 2020-10-07 DIAGNOSIS — S52122A Displaced fracture of head of left radius, initial encounter for closed fracture: Secondary | ICD-10-CM | POA: Diagnosis not present

## 2020-10-07 DIAGNOSIS — Z8673 Personal history of transient ischemic attack (TIA), and cerebral infarction without residual deficits: Secondary | ICD-10-CM | POA: Diagnosis not present

## 2020-10-07 DIAGNOSIS — R0902 Hypoxemia: Secondary | ICD-10-CM | POA: Diagnosis not present

## 2020-10-07 DIAGNOSIS — M7989 Other specified soft tissue disorders: Secondary | ICD-10-CM | POA: Diagnosis not present

## 2020-10-07 DIAGNOSIS — R0781 Pleurodynia: Secondary | ICD-10-CM | POA: Diagnosis not present

## 2020-10-07 DIAGNOSIS — Z87891 Personal history of nicotine dependence: Secondary | ICD-10-CM | POA: Diagnosis not present

## 2020-10-07 DIAGNOSIS — I700 Atherosclerosis of aorta: Secondary | ICD-10-CM | POA: Diagnosis not present

## 2020-10-07 DIAGNOSIS — Z951 Presence of aortocoronary bypass graft: Secondary | ICD-10-CM | POA: Diagnosis not present

## 2020-10-07 DIAGNOSIS — R609 Edema, unspecified: Secondary | ICD-10-CM | POA: Diagnosis not present

## 2020-10-07 DIAGNOSIS — M25422 Effusion, left elbow: Secondary | ICD-10-CM | POA: Diagnosis not present

## 2020-10-07 DIAGNOSIS — M545 Low back pain, unspecified: Secondary | ICD-10-CM | POA: Diagnosis not present

## 2020-10-07 DIAGNOSIS — M1990 Unspecified osteoarthritis, unspecified site: Secondary | ICD-10-CM | POA: Diagnosis not present

## 2020-10-07 DIAGNOSIS — W19XXXA Unspecified fall, initial encounter: Secondary | ICD-10-CM | POA: Diagnosis not present

## 2020-10-07 DIAGNOSIS — S52125A Nondisplaced fracture of head of left radius, initial encounter for closed fracture: Secondary | ICD-10-CM | POA: Diagnosis not present

## 2020-10-08 DIAGNOSIS — R0902 Hypoxemia: Secondary | ICD-10-CM | POA: Diagnosis not present

## 2020-10-08 DIAGNOSIS — M545 Low back pain, unspecified: Secondary | ICD-10-CM | POA: Diagnosis not present

## 2020-10-08 DIAGNOSIS — W19XXXA Unspecified fall, initial encounter: Secondary | ICD-10-CM | POA: Diagnosis not present

## 2020-10-08 DIAGNOSIS — M549 Dorsalgia, unspecified: Secondary | ICD-10-CM | POA: Diagnosis not present

## 2020-10-08 DIAGNOSIS — R404 Transient alteration of awareness: Secondary | ICD-10-CM | POA: Diagnosis not present

## 2020-10-08 DIAGNOSIS — M546 Pain in thoracic spine: Secondary | ICD-10-CM | POA: Diagnosis not present

## 2020-10-08 DIAGNOSIS — R52 Pain, unspecified: Secondary | ICD-10-CM | POA: Diagnosis not present

## 2020-10-10 DIAGNOSIS — E039 Hypothyroidism, unspecified: Secondary | ICD-10-CM | POA: Diagnosis not present

## 2020-10-10 DIAGNOSIS — I10 Essential (primary) hypertension: Secondary | ICD-10-CM | POA: Diagnosis not present

## 2020-10-10 DIAGNOSIS — K449 Diaphragmatic hernia without obstruction or gangrene: Secondary | ICD-10-CM | POA: Diagnosis not present

## 2020-10-10 DIAGNOSIS — R1084 Generalized abdominal pain: Secondary | ICD-10-CM | POA: Diagnosis not present

## 2020-10-10 DIAGNOSIS — Z7902 Long term (current) use of antithrombotics/antiplatelets: Secondary | ICD-10-CM | POA: Diagnosis not present

## 2020-10-10 DIAGNOSIS — J80 Acute respiratory distress syndrome: Secondary | ICD-10-CM | POA: Diagnosis not present

## 2020-10-10 DIAGNOSIS — G309 Alzheimer's disease, unspecified: Secondary | ICD-10-CM | POA: Diagnosis not present

## 2020-10-10 DIAGNOSIS — M6281 Muscle weakness (generalized): Secondary | ICD-10-CM | POA: Diagnosis not present

## 2020-10-10 DIAGNOSIS — R2689 Other abnormalities of gait and mobility: Secondary | ICD-10-CM | POA: Diagnosis not present

## 2020-10-10 DIAGNOSIS — I517 Cardiomegaly: Secondary | ICD-10-CM | POA: Diagnosis not present

## 2020-10-10 DIAGNOSIS — F0281 Dementia in other diseases classified elsewhere with behavioral disturbance: Secondary | ICD-10-CM | POA: Diagnosis not present

## 2020-10-10 DIAGNOSIS — F0390 Unspecified dementia without behavioral disturbance: Secondary | ICD-10-CM | POA: Diagnosis not present

## 2020-10-10 DIAGNOSIS — J9811 Atelectasis: Secondary | ICD-10-CM | POA: Diagnosis not present

## 2020-10-10 DIAGNOSIS — K219 Gastro-esophageal reflux disease without esophagitis: Secondary | ICD-10-CM | POA: Diagnosis not present

## 2020-10-10 DIAGNOSIS — R109 Unspecified abdominal pain: Secondary | ICD-10-CM | POA: Diagnosis not present

## 2020-10-10 DIAGNOSIS — K529 Noninfective gastroenteritis and colitis, unspecified: Secondary | ICD-10-CM | POA: Diagnosis not present

## 2020-10-10 DIAGNOSIS — R140 Abdominal distension (gaseous): Secondary | ICD-10-CM | POA: Diagnosis not present

## 2020-10-10 DIAGNOSIS — Z20828 Contact with and (suspected) exposure to other viral communicable diseases: Secondary | ICD-10-CM | POA: Diagnosis not present

## 2020-10-10 DIAGNOSIS — U071 COVID-19: Secondary | ICD-10-CM | POA: Diagnosis not present

## 2020-10-10 DIAGNOSIS — S2242XA Multiple fractures of ribs, left side, initial encounter for closed fracture: Secondary | ICD-10-CM | POA: Diagnosis not present

## 2020-10-10 DIAGNOSIS — M5136 Other intervertebral disc degeneration, lumbar region: Secondary | ICD-10-CM | POA: Diagnosis not present

## 2020-10-10 DIAGNOSIS — J189 Pneumonia, unspecified organism: Secondary | ICD-10-CM | POA: Diagnosis not present

## 2020-10-10 DIAGNOSIS — R062 Wheezing: Secondary | ICD-10-CM | POA: Diagnosis not present

## 2020-10-10 DIAGNOSIS — R269 Unspecified abnormalities of gait and mobility: Secondary | ICD-10-CM | POA: Diagnosis not present

## 2020-10-10 DIAGNOSIS — Z9183 Wandering in diseases classified elsewhere: Secondary | ICD-10-CM | POA: Diagnosis not present

## 2020-10-10 DIAGNOSIS — R059 Cough, unspecified: Secondary | ICD-10-CM | POA: Diagnosis not present

## 2020-10-10 DIAGNOSIS — Z8673 Personal history of transient ischemic attack (TIA), and cerebral infarction without residual deficits: Secondary | ICD-10-CM | POA: Diagnosis not present

## 2020-10-10 DIAGNOSIS — R0902 Hypoxemia: Secondary | ICD-10-CM | POA: Diagnosis not present

## 2020-10-10 DIAGNOSIS — S52532A Colles' fracture of left radius, initial encounter for closed fracture: Secondary | ICD-10-CM | POA: Diagnosis not present

## 2020-10-10 DIAGNOSIS — R0602 Shortness of breath: Secondary | ICD-10-CM | POA: Diagnosis not present

## 2020-10-11 DIAGNOSIS — S2242XD Multiple fractures of ribs, left side, subsequent encounter for fracture with routine healing: Secondary | ICD-10-CM | POA: Diagnosis not present

## 2020-10-11 DIAGNOSIS — J9811 Atelectasis: Secondary | ICD-10-CM | POA: Diagnosis not present

## 2020-10-11 DIAGNOSIS — E039 Hypothyroidism, unspecified: Secondary | ICD-10-CM | POA: Diagnosis not present

## 2020-10-11 DIAGNOSIS — Z452 Encounter for adjustment and management of vascular access device: Secondary | ICD-10-CM | POA: Diagnosis not present

## 2020-10-11 DIAGNOSIS — K449 Diaphragmatic hernia without obstruction or gangrene: Secondary | ICD-10-CM | POA: Diagnosis not present

## 2020-10-11 DIAGNOSIS — I517 Cardiomegaly: Secondary | ICD-10-CM | POA: Diagnosis not present

## 2020-10-11 DIAGNOSIS — A049 Bacterial intestinal infection, unspecified: Secondary | ICD-10-CM | POA: Diagnosis not present

## 2020-10-11 DIAGNOSIS — U071 COVID-19: Secondary | ICD-10-CM | POA: Diagnosis not present

## 2020-10-11 DIAGNOSIS — S52125D Nondisplaced fracture of head of left radius, subsequent encounter for closed fracture with routine healing: Secondary | ICD-10-CM | POA: Diagnosis not present

## 2020-10-11 DIAGNOSIS — A0839 Other viral enteritis: Secondary | ICD-10-CM | POA: Diagnosis not present

## 2020-10-11 DIAGNOSIS — R059 Cough, unspecified: Secondary | ICD-10-CM | POA: Diagnosis not present

## 2020-10-11 DIAGNOSIS — E785 Hyperlipidemia, unspecified: Secondary | ICD-10-CM | POA: Diagnosis not present

## 2020-10-11 DIAGNOSIS — E871 Hypo-osmolality and hyponatremia: Secondary | ICD-10-CM | POA: Diagnosis not present

## 2020-10-11 DIAGNOSIS — S2242XA Multiple fractures of ribs, left side, initial encounter for closed fracture: Secondary | ICD-10-CM | POA: Diagnosis not present

## 2020-10-11 DIAGNOSIS — J189 Pneumonia, unspecified organism: Secondary | ICD-10-CM | POA: Diagnosis not present

## 2020-10-11 DIAGNOSIS — R109 Unspecified abdominal pain: Secondary | ICD-10-CM | POA: Diagnosis not present

## 2020-10-11 DIAGNOSIS — I10 Essential (primary) hypertension: Secondary | ICD-10-CM | POA: Diagnosis not present

## 2020-10-11 DIAGNOSIS — K529 Noninfective gastroenteritis and colitis, unspecified: Secondary | ICD-10-CM | POA: Diagnosis not present

## 2020-10-13 DIAGNOSIS — U071 COVID-19: Secondary | ICD-10-CM | POA: Diagnosis not present

## 2020-10-13 DIAGNOSIS — Z452 Encounter for adjustment and management of vascular access device: Secondary | ICD-10-CM | POA: Diagnosis not present

## 2020-10-18 ENCOUNTER — Other Ambulatory Visit: Payer: Self-pay

## 2020-10-18 DIAGNOSIS — Z8673 Personal history of transient ischemic attack (TIA), and cerebral infarction without residual deficits: Secondary | ICD-10-CM | POA: Diagnosis not present

## 2020-10-18 DIAGNOSIS — Z9183 Wandering in diseases classified elsewhere: Secondary | ICD-10-CM | POA: Diagnosis not present

## 2020-10-18 DIAGNOSIS — Z7902 Long term (current) use of antithrombotics/antiplatelets: Secondary | ICD-10-CM | POA: Diagnosis not present

## 2020-10-18 DIAGNOSIS — I10 Essential (primary) hypertension: Secondary | ICD-10-CM | POA: Diagnosis not present

## 2020-10-18 DIAGNOSIS — F0281 Dementia in other diseases classified elsewhere with behavioral disturbance: Secondary | ICD-10-CM | POA: Diagnosis not present

## 2020-10-18 DIAGNOSIS — K219 Gastro-esophageal reflux disease without esophagitis: Secondary | ICD-10-CM | POA: Diagnosis not present

## 2020-10-18 DIAGNOSIS — G309 Alzheimer's disease, unspecified: Secondary | ICD-10-CM | POA: Diagnosis not present

## 2020-10-18 DIAGNOSIS — E039 Hypothyroidism, unspecified: Secondary | ICD-10-CM | POA: Diagnosis not present

## 2020-10-18 DIAGNOSIS — M5136 Other intervertebral disc degeneration, lumbar region: Secondary | ICD-10-CM | POA: Diagnosis not present

## 2020-10-18 NOTE — Patient Outreach (Signed)
[facility]) Care Management  10/18/2020  HIILANI JETTER [birth year] [id number]     Transition of Care Referral  Referral Date: 10/18/2020  Referral Source: [facility] Discharge Report Date of Discharge: 10/17/2020 Facility: [facility]     Referral received. No outreach warranted a this time. TOC will be competed by PCP office who will refer to [facility] as needed.    Plan: RN CM will close referral.   Antionette Fairy, RN,BSN,CCM [facility] Care Management Telephonic Care Management Coordinator Direct Phone: [phone] Toll Free: [phone] Fax: [phone]

## 2020-10-19 DIAGNOSIS — F0390 Unspecified dementia without behavioral disturbance: Secondary | ICD-10-CM | POA: Diagnosis not present

## 2020-10-19 DIAGNOSIS — E039 Hypothyroidism, unspecified: Secondary | ICD-10-CM | POA: Diagnosis not present

## 2020-10-19 DIAGNOSIS — M6281 Muscle weakness (generalized): Secondary | ICD-10-CM | POA: Diagnosis not present

## 2020-10-19 DIAGNOSIS — R269 Unspecified abnormalities of gait and mobility: Secondary | ICD-10-CM | POA: Diagnosis not present

## 2020-10-19 DIAGNOSIS — S52125A Nondisplaced fracture of head of left radius, initial encounter for closed fracture: Secondary | ICD-10-CM | POA: Diagnosis not present

## 2020-10-19 DIAGNOSIS — I700 Atherosclerosis of aorta: Secondary | ICD-10-CM | POA: Diagnosis not present

## 2020-10-19 DIAGNOSIS — K529 Noninfective gastroenteritis and colitis, unspecified: Secondary | ICD-10-CM | POA: Diagnosis not present

## 2020-10-19 DIAGNOSIS — S2242XA Multiple fractures of ribs, left side, initial encounter for closed fracture: Secondary | ICD-10-CM | POA: Diagnosis not present

## 2020-10-20 DIAGNOSIS — S52122A Displaced fracture of head of left radius, initial encounter for closed fracture: Secondary | ICD-10-CM | POA: Diagnosis not present

## 2020-10-21 DIAGNOSIS — G309 Alzheimer's disease, unspecified: Secondary | ICD-10-CM | POA: Diagnosis not present

## 2020-10-21 DIAGNOSIS — R269 Unspecified abnormalities of gait and mobility: Secondary | ICD-10-CM | POA: Diagnosis not present

## 2020-10-21 DIAGNOSIS — S2249XD Multiple fractures of ribs, unspecified side, subsequent encounter for fracture with routine healing: Secondary | ICD-10-CM | POA: Diagnosis not present

## 2020-10-21 DIAGNOSIS — Z9183 Wandering in diseases classified elsewhere: Secondary | ICD-10-CM | POA: Diagnosis not present

## 2020-10-21 DIAGNOSIS — U071 COVID-19: Secondary | ICD-10-CM | POA: Diagnosis not present

## 2020-10-21 DIAGNOSIS — I10 Essential (primary) hypertension: Secondary | ICD-10-CM | POA: Diagnosis not present

## 2020-10-21 DIAGNOSIS — Z9181 History of falling: Secondary | ICD-10-CM | POA: Diagnosis not present

## 2020-10-21 DIAGNOSIS — R54 Age-related physical debility: Secondary | ICD-10-CM | POA: Diagnosis not present

## 2020-10-21 DIAGNOSIS — S52122D Displaced fracture of head of left radius, subsequent encounter for closed fracture with routine healing: Secondary | ICD-10-CM | POA: Diagnosis not present

## 2020-10-21 DIAGNOSIS — M6281 Muscle weakness (generalized): Secondary | ICD-10-CM | POA: Diagnosis not present

## 2020-10-21 DIAGNOSIS — R296 Repeated falls: Secondary | ICD-10-CM | POA: Diagnosis not present

## 2020-10-21 DIAGNOSIS — W19XXXA Unspecified fall, initial encounter: Secondary | ICD-10-CM | POA: Diagnosis not present

## 2020-10-21 DIAGNOSIS — F0390 Unspecified dementia without behavioral disturbance: Secondary | ICD-10-CM | POA: Diagnosis not present

## 2020-10-21 DIAGNOSIS — F0281 Dementia in other diseases classified elsewhere with behavioral disturbance: Secondary | ICD-10-CM | POA: Diagnosis not present

## 2020-10-21 DIAGNOSIS — M5136 Other intervertebral disc degeneration, lumbar region: Secondary | ICD-10-CM | POA: Diagnosis not present

## 2020-10-21 DIAGNOSIS — E039 Hypothyroidism, unspecified: Secondary | ICD-10-CM | POA: Diagnosis not present

## 2020-10-26 DIAGNOSIS — U071 COVID-19: Secondary | ICD-10-CM | POA: Diagnosis not present

## 2020-10-26 DIAGNOSIS — S2249XD Multiple fractures of ribs, unspecified side, subsequent encounter for fracture with routine healing: Secondary | ICD-10-CM | POA: Diagnosis not present

## 2020-10-26 DIAGNOSIS — E039 Hypothyroidism, unspecified: Secondary | ICD-10-CM | POA: Diagnosis not present

## 2020-10-26 DIAGNOSIS — Z9183 Wandering in diseases classified elsewhere: Secondary | ICD-10-CM | POA: Diagnosis not present

## 2020-10-26 DIAGNOSIS — S52122D Displaced fracture of head of left radius, subsequent encounter for closed fracture with routine healing: Secondary | ICD-10-CM | POA: Diagnosis not present

## 2020-10-26 DIAGNOSIS — F0281 Dementia in other diseases classified elsewhere with behavioral disturbance: Secondary | ICD-10-CM | POA: Diagnosis not present

## 2020-10-26 DIAGNOSIS — G309 Alzheimer's disease, unspecified: Secondary | ICD-10-CM | POA: Diagnosis not present

## 2020-10-26 DIAGNOSIS — M5136 Other intervertebral disc degeneration, lumbar region: Secondary | ICD-10-CM | POA: Diagnosis not present

## 2020-10-26 DIAGNOSIS — I10 Essential (primary) hypertension: Secondary | ICD-10-CM | POA: Diagnosis not present

## 2020-10-27 DIAGNOSIS — G309 Alzheimer's disease, unspecified: Secondary | ICD-10-CM | POA: Diagnosis not present

## 2020-10-28 DIAGNOSIS — M5136 Other intervertebral disc degeneration, lumbar region: Secondary | ICD-10-CM | POA: Diagnosis not present

## 2020-10-28 DIAGNOSIS — S52122D Displaced fracture of head of left radius, subsequent encounter for closed fracture with routine healing: Secondary | ICD-10-CM | POA: Diagnosis not present

## 2020-10-28 DIAGNOSIS — G309 Alzheimer's disease, unspecified: Secondary | ICD-10-CM | POA: Diagnosis not present

## 2020-10-28 DIAGNOSIS — U071 COVID-19: Secondary | ICD-10-CM | POA: Diagnosis not present

## 2020-10-28 DIAGNOSIS — F0281 Dementia in other diseases classified elsewhere with behavioral disturbance: Secondary | ICD-10-CM | POA: Diagnosis not present

## 2020-10-28 DIAGNOSIS — S2249XD Multiple fractures of ribs, unspecified side, subsequent encounter for fracture with routine healing: Secondary | ICD-10-CM | POA: Diagnosis not present

## 2020-10-28 DIAGNOSIS — Z9183 Wandering in diseases classified elsewhere: Secondary | ICD-10-CM | POA: Diagnosis not present

## 2020-10-28 DIAGNOSIS — E039 Hypothyroidism, unspecified: Secondary | ICD-10-CM | POA: Diagnosis not present

## 2020-10-28 DIAGNOSIS — I10 Essential (primary) hypertension: Secondary | ICD-10-CM | POA: Diagnosis not present

## 2020-10-31 DIAGNOSIS — U071 COVID-19: Secondary | ICD-10-CM | POA: Diagnosis not present

## 2020-10-31 DIAGNOSIS — I10 Essential (primary) hypertension: Secondary | ICD-10-CM | POA: Diagnosis not present

## 2020-10-31 DIAGNOSIS — F0281 Dementia in other diseases classified elsewhere with behavioral disturbance: Secondary | ICD-10-CM | POA: Diagnosis not present

## 2020-10-31 DIAGNOSIS — S52122D Displaced fracture of head of left radius, subsequent encounter for closed fracture with routine healing: Secondary | ICD-10-CM | POA: Diagnosis not present

## 2020-10-31 DIAGNOSIS — G309 Alzheimer's disease, unspecified: Secondary | ICD-10-CM | POA: Diagnosis not present

## 2020-10-31 DIAGNOSIS — Z9183 Wandering in diseases classified elsewhere: Secondary | ICD-10-CM | POA: Diagnosis not present

## 2020-10-31 DIAGNOSIS — E039 Hypothyroidism, unspecified: Secondary | ICD-10-CM | POA: Diagnosis not present

## 2020-10-31 DIAGNOSIS — M5136 Other intervertebral disc degeneration, lumbar region: Secondary | ICD-10-CM | POA: Diagnosis not present

## 2020-10-31 DIAGNOSIS — S2249XD Multiple fractures of ribs, unspecified side, subsequent encounter for fracture with routine healing: Secondary | ICD-10-CM | POA: Diagnosis not present

## 2020-11-02 DIAGNOSIS — S2249XD Multiple fractures of ribs, unspecified side, subsequent encounter for fracture with routine healing: Secondary | ICD-10-CM | POA: Diagnosis not present

## 2020-11-02 DIAGNOSIS — G309 Alzheimer's disease, unspecified: Secondary | ICD-10-CM | POA: Diagnosis not present

## 2020-11-02 DIAGNOSIS — M5136 Other intervertebral disc degeneration, lumbar region: Secondary | ICD-10-CM | POA: Diagnosis not present

## 2020-11-02 DIAGNOSIS — S52122D Displaced fracture of head of left radius, subsequent encounter for closed fracture with routine healing: Secondary | ICD-10-CM | POA: Diagnosis not present

## 2020-11-02 DIAGNOSIS — Z9183 Wandering in diseases classified elsewhere: Secondary | ICD-10-CM | POA: Diagnosis not present

## 2020-11-02 DIAGNOSIS — E039 Hypothyroidism, unspecified: Secondary | ICD-10-CM | POA: Diagnosis not present

## 2020-11-02 DIAGNOSIS — F0281 Dementia in other diseases classified elsewhere with behavioral disturbance: Secondary | ICD-10-CM | POA: Diagnosis not present

## 2020-11-02 DIAGNOSIS — U071 COVID-19: Secondary | ICD-10-CM | POA: Diagnosis not present

## 2020-11-02 DIAGNOSIS — I10 Essential (primary) hypertension: Secondary | ICD-10-CM | POA: Diagnosis not present

## 2020-11-08 DIAGNOSIS — Z9183 Wandering in diseases classified elsewhere: Secondary | ICD-10-CM | POA: Diagnosis not present

## 2020-11-08 DIAGNOSIS — I10 Essential (primary) hypertension: Secondary | ICD-10-CM | POA: Diagnosis not present

## 2020-11-08 DIAGNOSIS — E039 Hypothyroidism, unspecified: Secondary | ICD-10-CM | POA: Diagnosis not present

## 2020-11-08 DIAGNOSIS — U071 COVID-19: Secondary | ICD-10-CM | POA: Diagnosis not present

## 2020-11-08 DIAGNOSIS — S2249XD Multiple fractures of ribs, unspecified side, subsequent encounter for fracture with routine healing: Secondary | ICD-10-CM | POA: Diagnosis not present

## 2020-11-08 DIAGNOSIS — F0281 Dementia in other diseases classified elsewhere with behavioral disturbance: Secondary | ICD-10-CM | POA: Diagnosis not present

## 2020-11-08 DIAGNOSIS — G309 Alzheimer's disease, unspecified: Secondary | ICD-10-CM | POA: Diagnosis not present

## 2020-11-08 DIAGNOSIS — S52122D Displaced fracture of head of left radius, subsequent encounter for closed fracture with routine healing: Secondary | ICD-10-CM | POA: Diagnosis not present

## 2020-11-08 DIAGNOSIS — M5136 Other intervertebral disc degeneration, lumbar region: Secondary | ICD-10-CM | POA: Diagnosis not present

## 2020-11-09 DIAGNOSIS — W19XXXA Unspecified fall, initial encounter: Secondary | ICD-10-CM | POA: Diagnosis not present

## 2020-11-09 DIAGNOSIS — Z8673 Personal history of transient ischemic attack (TIA), and cerebral infarction without residual deficits: Secondary | ICD-10-CM | POA: Diagnosis not present

## 2020-11-09 DIAGNOSIS — Z9181 History of falling: Secondary | ICD-10-CM | POA: Diagnosis not present

## 2020-11-09 DIAGNOSIS — F0390 Unspecified dementia without behavioral disturbance: Secondary | ICD-10-CM | POA: Diagnosis not present

## 2020-11-09 DIAGNOSIS — M6281 Muscle weakness (generalized): Secondary | ICD-10-CM | POA: Diagnosis not present

## 2020-11-09 DIAGNOSIS — S5012XA Contusion of left forearm, initial encounter: Secondary | ICD-10-CM | POA: Diagnosis not present

## 2020-11-09 DIAGNOSIS — R54 Age-related physical debility: Secondary | ICD-10-CM | POA: Diagnosis not present

## 2020-11-09 DIAGNOSIS — R269 Unspecified abnormalities of gait and mobility: Secondary | ICD-10-CM | POA: Diagnosis not present

## 2020-11-10 DIAGNOSIS — R2689 Other abnormalities of gait and mobility: Secondary | ICD-10-CM | POA: Diagnosis not present

## 2020-11-10 DIAGNOSIS — F0390 Unspecified dementia without behavioral disturbance: Secondary | ICD-10-CM | POA: Diagnosis not present

## 2020-11-10 DIAGNOSIS — M6281 Muscle weakness (generalized): Secondary | ICD-10-CM | POA: Diagnosis not present

## 2020-11-10 DIAGNOSIS — Z9181 History of falling: Secondary | ICD-10-CM | POA: Diagnosis not present

## 2020-11-10 DIAGNOSIS — R269 Unspecified abnormalities of gait and mobility: Secondary | ICD-10-CM | POA: Diagnosis not present

## 2020-11-10 DIAGNOSIS — R296 Repeated falls: Secondary | ICD-10-CM | POA: Diagnosis not present

## 2020-11-10 DIAGNOSIS — W19XXXA Unspecified fall, initial encounter: Secondary | ICD-10-CM | POA: Diagnosis not present

## 2020-11-11 DIAGNOSIS — M5136 Other intervertebral disc degeneration, lumbar region: Secondary | ICD-10-CM | POA: Diagnosis not present

## 2020-11-11 DIAGNOSIS — I10 Essential (primary) hypertension: Secondary | ICD-10-CM | POA: Diagnosis not present

## 2020-11-11 DIAGNOSIS — S2249XD Multiple fractures of ribs, unspecified side, subsequent encounter for fracture with routine healing: Secondary | ICD-10-CM | POA: Diagnosis not present

## 2020-11-11 DIAGNOSIS — G309 Alzheimer's disease, unspecified: Secondary | ICD-10-CM | POA: Diagnosis not present

## 2020-11-11 DIAGNOSIS — Z9183 Wandering in diseases classified elsewhere: Secondary | ICD-10-CM | POA: Diagnosis not present

## 2020-11-11 DIAGNOSIS — U071 COVID-19: Secondary | ICD-10-CM | POA: Diagnosis not present

## 2020-11-11 DIAGNOSIS — E039 Hypothyroidism, unspecified: Secondary | ICD-10-CM | POA: Diagnosis not present

## 2020-11-11 DIAGNOSIS — S52122D Displaced fracture of head of left radius, subsequent encounter for closed fracture with routine healing: Secondary | ICD-10-CM | POA: Diagnosis not present

## 2020-11-11 DIAGNOSIS — F0281 Dementia in other diseases classified elsewhere with behavioral disturbance: Secondary | ICD-10-CM | POA: Diagnosis not present

## 2020-11-15 DIAGNOSIS — M5136 Other intervertebral disc degeneration, lumbar region: Secondary | ICD-10-CM | POA: Diagnosis not present

## 2020-11-15 DIAGNOSIS — G309 Alzheimer's disease, unspecified: Secondary | ICD-10-CM | POA: Diagnosis not present

## 2020-11-15 DIAGNOSIS — S2249XD Multiple fractures of ribs, unspecified side, subsequent encounter for fracture with routine healing: Secondary | ICD-10-CM | POA: Diagnosis not present

## 2020-11-15 DIAGNOSIS — U071 COVID-19: Secondary | ICD-10-CM | POA: Diagnosis not present

## 2020-11-15 DIAGNOSIS — E039 Hypothyroidism, unspecified: Secondary | ICD-10-CM | POA: Diagnosis not present

## 2020-11-15 DIAGNOSIS — Z9183 Wandering in diseases classified elsewhere: Secondary | ICD-10-CM | POA: Diagnosis not present

## 2020-11-15 DIAGNOSIS — F0281 Dementia in other diseases classified elsewhere with behavioral disturbance: Secondary | ICD-10-CM | POA: Diagnosis not present

## 2020-11-15 DIAGNOSIS — I10 Essential (primary) hypertension: Secondary | ICD-10-CM | POA: Diagnosis not present

## 2020-11-15 DIAGNOSIS — S52122D Displaced fracture of head of left radius, subsequent encounter for closed fracture with routine healing: Secondary | ICD-10-CM | POA: Diagnosis not present

## 2020-11-16 DIAGNOSIS — R296 Repeated falls: Secondary | ICD-10-CM | POA: Diagnosis not present

## 2020-11-16 DIAGNOSIS — F0390 Unspecified dementia without behavioral disturbance: Secondary | ICD-10-CM | POA: Diagnosis not present

## 2020-11-16 DIAGNOSIS — F0391 Unspecified dementia with behavioral disturbance: Secondary | ICD-10-CM | POA: Diagnosis not present

## 2020-11-16 DIAGNOSIS — R52 Pain, unspecified: Secondary | ICD-10-CM | POA: Diagnosis not present

## 2020-11-16 DIAGNOSIS — F5102 Adjustment insomnia: Secondary | ICD-10-CM | POA: Diagnosis not present

## 2020-11-16 DIAGNOSIS — R54 Age-related physical debility: Secondary | ICD-10-CM | POA: Diagnosis not present

## 2020-11-17 DIAGNOSIS — W19XXXA Unspecified fall, initial encounter: Secondary | ICD-10-CM | POA: Diagnosis not present

## 2020-11-17 DIAGNOSIS — R4181 Age-related cognitive decline: Secondary | ICD-10-CM | POA: Diagnosis not present

## 2020-11-17 DIAGNOSIS — F0390 Unspecified dementia without behavioral disturbance: Secondary | ICD-10-CM | POA: Diagnosis not present

## 2020-11-17 DIAGNOSIS — Z9181 History of falling: Secondary | ICD-10-CM | POA: Diagnosis not present

## 2020-11-17 DIAGNOSIS — R269 Unspecified abnormalities of gait and mobility: Secondary | ICD-10-CM | POA: Diagnosis not present

## 2020-11-17 DIAGNOSIS — M6281 Muscle weakness (generalized): Secondary | ICD-10-CM | POA: Diagnosis not present

## 2020-11-17 DIAGNOSIS — S5011XA Contusion of right forearm, initial encounter: Secondary | ICD-10-CM | POA: Diagnosis not present

## 2020-11-18 DIAGNOSIS — G309 Alzheimer's disease, unspecified: Secondary | ICD-10-CM | POA: Diagnosis not present

## 2020-11-18 DIAGNOSIS — I10 Essential (primary) hypertension: Secondary | ICD-10-CM | POA: Diagnosis not present

## 2020-11-18 DIAGNOSIS — E039 Hypothyroidism, unspecified: Secondary | ICD-10-CM | POA: Diagnosis not present

## 2020-11-18 DIAGNOSIS — F0281 Dementia in other diseases classified elsewhere with behavioral disturbance: Secondary | ICD-10-CM | POA: Diagnosis not present

## 2020-11-18 DIAGNOSIS — Z9183 Wandering in diseases classified elsewhere: Secondary | ICD-10-CM | POA: Diagnosis not present

## 2020-11-18 DIAGNOSIS — M5136 Other intervertebral disc degeneration, lumbar region: Secondary | ICD-10-CM | POA: Diagnosis not present

## 2020-11-18 DIAGNOSIS — S2249XD Multiple fractures of ribs, unspecified side, subsequent encounter for fracture with routine healing: Secondary | ICD-10-CM | POA: Diagnosis not present

## 2020-11-18 DIAGNOSIS — U071 COVID-19: Secondary | ICD-10-CM | POA: Diagnosis not present

## 2020-11-18 DIAGNOSIS — S52122D Displaced fracture of head of left radius, subsequent encounter for closed fracture with routine healing: Secondary | ICD-10-CM | POA: Diagnosis not present

## 2020-11-20 DIAGNOSIS — G309 Alzheimer's disease, unspecified: Secondary | ICD-10-CM | POA: Diagnosis not present

## 2020-11-20 DIAGNOSIS — S2249XD Multiple fractures of ribs, unspecified side, subsequent encounter for fracture with routine healing: Secondary | ICD-10-CM | POA: Diagnosis not present

## 2020-11-20 DIAGNOSIS — M5136 Other intervertebral disc degeneration, lumbar region: Secondary | ICD-10-CM | POA: Diagnosis not present

## 2020-11-20 DIAGNOSIS — Z9183 Wandering in diseases classified elsewhere: Secondary | ICD-10-CM | POA: Diagnosis not present

## 2020-11-20 DIAGNOSIS — E039 Hypothyroidism, unspecified: Secondary | ICD-10-CM | POA: Diagnosis not present

## 2020-11-20 DIAGNOSIS — I10 Essential (primary) hypertension: Secondary | ICD-10-CM | POA: Diagnosis not present

## 2020-11-20 DIAGNOSIS — F02818 Dementia in other diseases classified elsewhere, unspecified severity, with other behavioral disturbance: Secondary | ICD-10-CM | POA: Diagnosis not present

## 2020-11-20 DIAGNOSIS — U071 COVID-19: Secondary | ICD-10-CM | POA: Diagnosis not present

## 2020-11-20 DIAGNOSIS — S52122D Displaced fracture of head of left radius, subsequent encounter for closed fracture with routine healing: Secondary | ICD-10-CM | POA: Diagnosis not present

## 2020-11-24 DIAGNOSIS — I700 Atherosclerosis of aorta: Secondary | ICD-10-CM | POA: Diagnosis not present

## 2020-11-24 DIAGNOSIS — F0390 Unspecified dementia without behavioral disturbance: Secondary | ICD-10-CM | POA: Diagnosis not present

## 2020-11-24 DIAGNOSIS — W19XXXA Unspecified fall, initial encounter: Secondary | ICD-10-CM | POA: Diagnosis not present

## 2020-11-24 DIAGNOSIS — M47813 Spondylosis without myelopathy or radiculopathy, cervicothoracic region: Secondary | ICD-10-CM | POA: Diagnosis not present

## 2020-11-24 DIAGNOSIS — U071 COVID-19: Secondary | ICD-10-CM | POA: Diagnosis not present

## 2020-11-24 DIAGNOSIS — R0902 Hypoxemia: Secondary | ICD-10-CM | POA: Diagnosis not present

## 2020-11-24 DIAGNOSIS — S52122D Displaced fracture of head of left radius, subsequent encounter for closed fracture with routine healing: Secondary | ICD-10-CM | POA: Diagnosis not present

## 2020-11-24 DIAGNOSIS — F02818 Dementia in other diseases classified elsewhere, unspecified severity, with other behavioral disturbance: Secondary | ICD-10-CM | POA: Diagnosis not present

## 2020-11-24 DIAGNOSIS — M47812 Spondylosis without myelopathy or radiculopathy, cervical region: Secondary | ICD-10-CM | POA: Diagnosis not present

## 2020-11-24 DIAGNOSIS — S2249XD Multiple fractures of ribs, unspecified side, subsequent encounter for fracture with routine healing: Secondary | ICD-10-CM | POA: Diagnosis not present

## 2020-11-24 DIAGNOSIS — E039 Hypothyroidism, unspecified: Secondary | ICD-10-CM | POA: Diagnosis not present

## 2020-11-24 DIAGNOSIS — I10 Essential (primary) hypertension: Secondary | ICD-10-CM | POA: Diagnosis not present

## 2020-11-24 DIAGNOSIS — M5136 Other intervertebral disc degeneration, lumbar region: Secondary | ICD-10-CM | POA: Diagnosis not present

## 2020-11-24 DIAGNOSIS — N3000 Acute cystitis without hematuria: Secondary | ICD-10-CM | POA: Diagnosis not present

## 2020-11-24 DIAGNOSIS — G319 Degenerative disease of nervous system, unspecified: Secondary | ICD-10-CM | POA: Diagnosis not present

## 2020-11-24 DIAGNOSIS — Z9183 Wandering in diseases classified elsewhere: Secondary | ICD-10-CM | POA: Diagnosis not present

## 2020-11-24 DIAGNOSIS — G9389 Other specified disorders of brain: Secondary | ICD-10-CM | POA: Diagnosis not present

## 2020-11-24 DIAGNOSIS — Z79899 Other long term (current) drug therapy: Secondary | ICD-10-CM | POA: Diagnosis not present

## 2020-11-24 DIAGNOSIS — R519 Headache, unspecified: Secondary | ICD-10-CM | POA: Diagnosis not present

## 2020-11-24 DIAGNOSIS — S0003XA Contusion of scalp, initial encounter: Secondary | ICD-10-CM | POA: Diagnosis not present

## 2020-11-24 DIAGNOSIS — S199XXA Unspecified injury of neck, initial encounter: Secondary | ICD-10-CM | POA: Diagnosis not present

## 2020-11-24 DIAGNOSIS — M4313 Spondylolisthesis, cervicothoracic region: Secondary | ICD-10-CM | POA: Diagnosis not present

## 2020-11-24 DIAGNOSIS — G309 Alzheimer's disease, unspecified: Secondary | ICD-10-CM | POA: Diagnosis not present

## 2020-11-25 DIAGNOSIS — F0390 Unspecified dementia without behavioral disturbance: Secondary | ICD-10-CM | POA: Diagnosis not present

## 2020-11-25 DIAGNOSIS — Z9181 History of falling: Secondary | ICD-10-CM | POA: Diagnosis not present

## 2020-11-25 DIAGNOSIS — R269 Unspecified abnormalities of gait and mobility: Secondary | ICD-10-CM | POA: Diagnosis not present

## 2020-11-25 DIAGNOSIS — N390 Urinary tract infection, site not specified: Secondary | ICD-10-CM | POA: Diagnosis not present

## 2020-11-25 DIAGNOSIS — R296 Repeated falls: Secondary | ICD-10-CM | POA: Diagnosis not present

## 2020-11-25 DIAGNOSIS — S0083XA Contusion of other part of head, initial encounter: Secondary | ICD-10-CM | POA: Diagnosis not present

## 2020-11-25 DIAGNOSIS — M6281 Muscle weakness (generalized): Secondary | ICD-10-CM | POA: Diagnosis not present

## 2020-11-27 DIAGNOSIS — G309 Alzheimer's disease, unspecified: Secondary | ICD-10-CM | POA: Diagnosis not present

## 2020-11-28 DIAGNOSIS — R2689 Other abnormalities of gait and mobility: Secondary | ICD-10-CM | POA: Diagnosis not present

## 2020-11-28 DIAGNOSIS — S51812A Laceration without foreign body of left forearm, initial encounter: Secondary | ICD-10-CM | POA: Diagnosis not present

## 2020-11-28 DIAGNOSIS — W19XXXA Unspecified fall, initial encounter: Secondary | ICD-10-CM | POA: Diagnosis not present

## 2020-11-28 DIAGNOSIS — Z8673 Personal history of transient ischemic attack (TIA), and cerebral infarction without residual deficits: Secondary | ICD-10-CM | POA: Diagnosis not present

## 2020-11-28 DIAGNOSIS — Z9181 History of falling: Secondary | ICD-10-CM | POA: Diagnosis not present

## 2020-11-28 DIAGNOSIS — M6281 Muscle weakness (generalized): Secondary | ICD-10-CM | POA: Diagnosis not present

## 2020-11-28 DIAGNOSIS — R269 Unspecified abnormalities of gait and mobility: Secondary | ICD-10-CM | POA: Diagnosis not present

## 2020-11-28 DIAGNOSIS — F0390 Unspecified dementia without behavioral disturbance: Secondary | ICD-10-CM | POA: Diagnosis not present

## 2020-11-30 DIAGNOSIS — Z9183 Wandering in diseases classified elsewhere: Secondary | ICD-10-CM | POA: Diagnosis not present

## 2020-11-30 DIAGNOSIS — Z23 Encounter for immunization: Secondary | ICD-10-CM | POA: Diagnosis not present

## 2020-11-30 DIAGNOSIS — E039 Hypothyroidism, unspecified: Secondary | ICD-10-CM | POA: Diagnosis not present

## 2020-11-30 DIAGNOSIS — M5136 Other intervertebral disc degeneration, lumbar region: Secondary | ICD-10-CM | POA: Diagnosis not present

## 2020-11-30 DIAGNOSIS — F02818 Dementia in other diseases classified elsewhere, unspecified severity, with other behavioral disturbance: Secondary | ICD-10-CM | POA: Diagnosis not present

## 2020-11-30 DIAGNOSIS — I10 Essential (primary) hypertension: Secondary | ICD-10-CM | POA: Diagnosis not present

## 2020-11-30 DIAGNOSIS — U071 COVID-19: Secondary | ICD-10-CM | POA: Diagnosis not present

## 2020-11-30 DIAGNOSIS — S2249XD Multiple fractures of ribs, unspecified side, subsequent encounter for fracture with routine healing: Secondary | ICD-10-CM | POA: Diagnosis not present

## 2020-11-30 DIAGNOSIS — G309 Alzheimer's disease, unspecified: Secondary | ICD-10-CM | POA: Diagnosis not present

## 2020-11-30 DIAGNOSIS — S52122D Displaced fracture of head of left radius, subsequent encounter for closed fracture with routine healing: Secondary | ICD-10-CM | POA: Diagnosis not present

## 2020-12-02 DIAGNOSIS — M6281 Muscle weakness (generalized): Secondary | ICD-10-CM | POA: Diagnosis not present

## 2020-12-02 DIAGNOSIS — F0390 Unspecified dementia without behavioral disturbance: Secondary | ICD-10-CM | POA: Diagnosis not present

## 2020-12-02 DIAGNOSIS — W19XXXA Unspecified fall, initial encounter: Secondary | ICD-10-CM | POA: Diagnosis not present

## 2020-12-02 DIAGNOSIS — R54 Age-related physical debility: Secondary | ICD-10-CM | POA: Diagnosis not present

## 2020-12-02 DIAGNOSIS — R269 Unspecified abnormalities of gait and mobility: Secondary | ICD-10-CM | POA: Diagnosis not present

## 2020-12-02 DIAGNOSIS — Z9181 History of falling: Secondary | ICD-10-CM | POA: Diagnosis not present

## 2020-12-02 DIAGNOSIS — R2689 Other abnormalities of gait and mobility: Secondary | ICD-10-CM | POA: Diagnosis not present

## 2020-12-02 DIAGNOSIS — S51811S Laceration without foreign body of right forearm, sequela: Secondary | ICD-10-CM | POA: Diagnosis not present

## 2020-12-06 DIAGNOSIS — U071 COVID-19: Secondary | ICD-10-CM | POA: Diagnosis not present

## 2020-12-06 DIAGNOSIS — M5136 Other intervertebral disc degeneration, lumbar region: Secondary | ICD-10-CM | POA: Diagnosis not present

## 2020-12-06 DIAGNOSIS — G309 Alzheimer's disease, unspecified: Secondary | ICD-10-CM | POA: Diagnosis not present

## 2020-12-06 DIAGNOSIS — Z9183 Wandering in diseases classified elsewhere: Secondary | ICD-10-CM | POA: Diagnosis not present

## 2020-12-06 DIAGNOSIS — I10 Essential (primary) hypertension: Secondary | ICD-10-CM | POA: Diagnosis not present

## 2020-12-06 DIAGNOSIS — S2249XD Multiple fractures of ribs, unspecified side, subsequent encounter for fracture with routine healing: Secondary | ICD-10-CM | POA: Diagnosis not present

## 2020-12-06 DIAGNOSIS — S52122D Displaced fracture of head of left radius, subsequent encounter for closed fracture with routine healing: Secondary | ICD-10-CM | POA: Diagnosis not present

## 2020-12-06 DIAGNOSIS — E039 Hypothyroidism, unspecified: Secondary | ICD-10-CM | POA: Diagnosis not present

## 2020-12-06 DIAGNOSIS — F02818 Dementia in other diseases classified elsewhere, unspecified severity, with other behavioral disturbance: Secondary | ICD-10-CM | POA: Diagnosis not present

## 2020-12-14 DIAGNOSIS — S52122D Displaced fracture of head of left radius, subsequent encounter for closed fracture with routine healing: Secondary | ICD-10-CM | POA: Diagnosis not present

## 2020-12-14 DIAGNOSIS — M5136 Other intervertebral disc degeneration, lumbar region: Secondary | ICD-10-CM | POA: Diagnosis not present

## 2020-12-14 DIAGNOSIS — E039 Hypothyroidism, unspecified: Secondary | ICD-10-CM | POA: Diagnosis not present

## 2020-12-14 DIAGNOSIS — U071 COVID-19: Secondary | ICD-10-CM | POA: Diagnosis not present

## 2020-12-14 DIAGNOSIS — F02818 Dementia in other diseases classified elsewhere, unspecified severity, with other behavioral disturbance: Secondary | ICD-10-CM | POA: Diagnosis not present

## 2020-12-14 DIAGNOSIS — S2249XD Multiple fractures of ribs, unspecified side, subsequent encounter for fracture with routine healing: Secondary | ICD-10-CM | POA: Diagnosis not present

## 2020-12-14 DIAGNOSIS — I10 Essential (primary) hypertension: Secondary | ICD-10-CM | POA: Diagnosis not present

## 2020-12-14 DIAGNOSIS — G309 Alzheimer's disease, unspecified: Secondary | ICD-10-CM | POA: Diagnosis not present

## 2020-12-14 DIAGNOSIS — Z9183 Wandering in diseases classified elsewhere: Secondary | ICD-10-CM | POA: Diagnosis not present

## 2020-12-21 DIAGNOSIS — F03918 Unspecified dementia, unspecified severity, with other behavioral disturbance: Secondary | ICD-10-CM | POA: Diagnosis not present

## 2020-12-21 DIAGNOSIS — F5102 Adjustment insomnia: Secondary | ICD-10-CM | POA: Diagnosis not present

## 2020-12-21 DIAGNOSIS — R296 Repeated falls: Secondary | ICD-10-CM | POA: Diagnosis not present

## 2020-12-28 DIAGNOSIS — Z9181 History of falling: Secondary | ICD-10-CM | POA: Diagnosis not present

## 2020-12-28 DIAGNOSIS — R2689 Other abnormalities of gait and mobility: Secondary | ICD-10-CM | POA: Diagnosis not present

## 2020-12-28 DIAGNOSIS — R54 Age-related physical debility: Secondary | ICD-10-CM | POA: Diagnosis not present

## 2020-12-28 DIAGNOSIS — W19XXXA Unspecified fall, initial encounter: Secondary | ICD-10-CM | POA: Diagnosis not present

## 2020-12-28 DIAGNOSIS — F0390 Unspecified dementia without behavioral disturbance: Secondary | ICD-10-CM | POA: Diagnosis not present

## 2020-12-28 DIAGNOSIS — M6281 Muscle weakness (generalized): Secondary | ICD-10-CM | POA: Diagnosis not present

## 2020-12-28 DIAGNOSIS — R269 Unspecified abnormalities of gait and mobility: Secondary | ICD-10-CM | POA: Diagnosis not present

## 2021-01-16 DIAGNOSIS — M6281 Muscle weakness (generalized): Secondary | ICD-10-CM | POA: Diagnosis not present

## 2021-01-16 DIAGNOSIS — W19XXXA Unspecified fall, initial encounter: Secondary | ICD-10-CM | POA: Diagnosis not present

## 2021-01-16 DIAGNOSIS — F0390 Unspecified dementia without behavioral disturbance: Secondary | ICD-10-CM | POA: Diagnosis not present

## 2021-01-16 DIAGNOSIS — Z9181 History of falling: Secondary | ICD-10-CM | POA: Diagnosis not present

## 2021-01-16 DIAGNOSIS — R2689 Other abnormalities of gait and mobility: Secondary | ICD-10-CM | POA: Diagnosis not present

## 2021-01-16 DIAGNOSIS — Z8673 Personal history of transient ischemic attack (TIA), and cerebral infarction without residual deficits: Secondary | ICD-10-CM | POA: Diagnosis not present

## 2021-01-16 DIAGNOSIS — S81011A Laceration without foreign body, right knee, initial encounter: Secondary | ICD-10-CM | POA: Diagnosis not present

## 2021-01-16 DIAGNOSIS — R269 Unspecified abnormalities of gait and mobility: Secondary | ICD-10-CM | POA: Diagnosis not present

## 2021-01-18 DIAGNOSIS — F5102 Adjustment insomnia: Secondary | ICD-10-CM | POA: Diagnosis not present

## 2021-01-18 DIAGNOSIS — F0390 Unspecified dementia without behavioral disturbance: Secondary | ICD-10-CM | POA: Diagnosis not present

## 2021-01-18 DIAGNOSIS — Z9181 History of falling: Secondary | ICD-10-CM | POA: Diagnosis not present

## 2021-01-18 DIAGNOSIS — R296 Repeated falls: Secondary | ICD-10-CM | POA: Diagnosis not present

## 2021-01-18 DIAGNOSIS — M6281 Muscle weakness (generalized): Secondary | ICD-10-CM | POA: Diagnosis not present

## 2021-01-18 DIAGNOSIS — R269 Unspecified abnormalities of gait and mobility: Secondary | ICD-10-CM | POA: Diagnosis not present

## 2021-01-18 DIAGNOSIS — F03918 Unspecified dementia, unspecified severity, with other behavioral disturbance: Secondary | ICD-10-CM | POA: Diagnosis not present

## 2021-01-18 DIAGNOSIS — W19XXXA Unspecified fall, initial encounter: Secondary | ICD-10-CM | POA: Diagnosis not present

## 2021-01-18 DIAGNOSIS — R2689 Other abnormalities of gait and mobility: Secondary | ICD-10-CM | POA: Diagnosis not present

## 2021-01-20 DIAGNOSIS — Z8673 Personal history of transient ischemic attack (TIA), and cerebral infarction without residual deficits: Secondary | ICD-10-CM | POA: Diagnosis not present

## 2021-01-20 DIAGNOSIS — Z9181 History of falling: Secondary | ICD-10-CM | POA: Diagnosis not present

## 2021-01-20 DIAGNOSIS — W19XXXA Unspecified fall, initial encounter: Secondary | ICD-10-CM | POA: Diagnosis not present

## 2021-01-20 DIAGNOSIS — F0390 Unspecified dementia without behavioral disturbance: Secondary | ICD-10-CM | POA: Diagnosis not present

## 2021-01-20 DIAGNOSIS — R269 Unspecified abnormalities of gait and mobility: Secondary | ICD-10-CM | POA: Diagnosis not present

## 2021-01-20 DIAGNOSIS — R2689 Other abnormalities of gait and mobility: Secondary | ICD-10-CM | POA: Diagnosis not present

## 2021-01-20 DIAGNOSIS — M6281 Muscle weakness (generalized): Secondary | ICD-10-CM | POA: Diagnosis not present

## 2021-01-23 DIAGNOSIS — N390 Urinary tract infection, site not specified: Secondary | ICD-10-CM | POA: Diagnosis not present

## 2021-01-24 DIAGNOSIS — R296 Repeated falls: Secondary | ICD-10-CM | POA: Diagnosis not present

## 2021-01-24 DIAGNOSIS — F03918 Unspecified dementia, unspecified severity, with other behavioral disturbance: Secondary | ICD-10-CM | POA: Diagnosis not present

## 2021-01-24 DIAGNOSIS — F5102 Adjustment insomnia: Secondary | ICD-10-CM | POA: Diagnosis not present

## 2021-01-25 DIAGNOSIS — F0390 Unspecified dementia without behavioral disturbance: Secondary | ICD-10-CM | POA: Diagnosis not present

## 2021-01-25 DIAGNOSIS — R296 Repeated falls: Secondary | ICD-10-CM | POA: Diagnosis not present

## 2021-01-25 DIAGNOSIS — Z87440 Personal history of urinary (tract) infections: Secondary | ICD-10-CM | POA: Diagnosis not present

## 2021-01-25 DIAGNOSIS — N390 Urinary tract infection, site not specified: Secondary | ICD-10-CM | POA: Diagnosis not present

## 2021-02-06 DIAGNOSIS — M6281 Muscle weakness (generalized): Secondary | ICD-10-CM | POA: Diagnosis not present

## 2021-02-06 DIAGNOSIS — R269 Unspecified abnormalities of gait and mobility: Secondary | ICD-10-CM | POA: Diagnosis not present

## 2021-02-06 DIAGNOSIS — F0390 Unspecified dementia without behavioral disturbance: Secondary | ICD-10-CM | POA: Diagnosis not present

## 2021-02-06 DIAGNOSIS — Z9181 History of falling: Secondary | ICD-10-CM | POA: Diagnosis not present

## 2021-02-06 DIAGNOSIS — W19XXXA Unspecified fall, initial encounter: Secondary | ICD-10-CM | POA: Diagnosis not present

## 2021-02-06 DIAGNOSIS — R2689 Other abnormalities of gait and mobility: Secondary | ICD-10-CM | POA: Diagnosis not present

## 2021-02-15 DIAGNOSIS — F5102 Adjustment insomnia: Secondary | ICD-10-CM | POA: Diagnosis not present

## 2021-02-15 DIAGNOSIS — F03918 Unspecified dementia, unspecified severity, with other behavioral disturbance: Secondary | ICD-10-CM | POA: Diagnosis not present

## 2021-02-15 DIAGNOSIS — R296 Repeated falls: Secondary | ICD-10-CM | POA: Diagnosis not present

## 2021-02-16 DIAGNOSIS — N390 Urinary tract infection, site not specified: Secondary | ICD-10-CM | POA: Diagnosis not present

## 2021-02-17 DIAGNOSIS — R269 Unspecified abnormalities of gait and mobility: Secondary | ICD-10-CM | POA: Diagnosis not present

## 2021-02-17 DIAGNOSIS — F0390 Unspecified dementia without behavioral disturbance: Secondary | ICD-10-CM | POA: Diagnosis not present

## 2021-02-17 DIAGNOSIS — M6281 Muscle weakness (generalized): Secondary | ICD-10-CM | POA: Diagnosis not present

## 2021-02-17 DIAGNOSIS — L304 Erythema intertrigo: Secondary | ICD-10-CM | POA: Diagnosis not present

## 2021-02-20 DIAGNOSIS — R296 Repeated falls: Secondary | ICD-10-CM | POA: Diagnosis not present

## 2021-02-20 DIAGNOSIS — Z87440 Personal history of urinary (tract) infections: Secondary | ICD-10-CM | POA: Diagnosis not present

## 2021-02-20 DIAGNOSIS — Z9181 History of falling: Secondary | ICD-10-CM | POA: Diagnosis not present

## 2021-02-20 DIAGNOSIS — N390 Urinary tract infection, site not specified: Secondary | ICD-10-CM | POA: Diagnosis not present

## 2021-02-20 DIAGNOSIS — F0390 Unspecified dementia without behavioral disturbance: Secondary | ICD-10-CM | POA: Diagnosis not present

## 2021-02-20 DIAGNOSIS — B964 Proteus (mirabilis) (morganii) as the cause of diseases classified elsewhere: Secondary | ICD-10-CM | POA: Diagnosis not present

## 2021-04-04 DIAGNOSIS — E039 Hypothyroidism, unspecified: Secondary | ICD-10-CM | POA: Diagnosis not present

## 2021-04-04 DIAGNOSIS — E559 Vitamin D deficiency, unspecified: Secondary | ICD-10-CM | POA: Diagnosis not present

## 2021-04-04 DIAGNOSIS — E119 Type 2 diabetes mellitus without complications: Secondary | ICD-10-CM | POA: Diagnosis not present

## 2021-04-04 DIAGNOSIS — D649 Anemia, unspecified: Secondary | ICD-10-CM | POA: Diagnosis not present

## 2021-04-04 DIAGNOSIS — I10 Essential (primary) hypertension: Secondary | ICD-10-CM | POA: Diagnosis not present

## 2021-04-04 DIAGNOSIS — E785 Hyperlipidemia, unspecified: Secondary | ICD-10-CM | POA: Diagnosis not present

## 2021-05-01 DIAGNOSIS — R0902 Hypoxemia: Secondary | ICD-10-CM | POA: Diagnosis not present

## 2021-05-01 DIAGNOSIS — R0681 Apnea, not elsewhere classified: Secondary | ICD-10-CM | POA: Diagnosis not present

## 2021-05-01 DIAGNOSIS — R0600 Dyspnea, unspecified: Secondary | ICD-10-CM | POA: Diagnosis not present

## 2021-05-01 DIAGNOSIS — R069 Unspecified abnormalities of breathing: Secondary | ICD-10-CM | POA: Diagnosis not present

## 2021-05-01 DIAGNOSIS — Z20822 Contact with and (suspected) exposure to covid-19: Secondary | ICD-10-CM | POA: Diagnosis not present

## 2021-06-22 DIAGNOSIS — R0989 Other specified symptoms and signs involving the circulatory and respiratory systems: Secondary | ICD-10-CM | POA: Diagnosis not present

## 2021-06-25 DIAGNOSIS — J209 Acute bronchitis, unspecified: Secondary | ICD-10-CM | POA: Diagnosis not present

## 2021-06-25 DIAGNOSIS — R739 Hyperglycemia, unspecified: Secondary | ICD-10-CM | POA: Diagnosis not present

## 2021-06-25 DIAGNOSIS — R059 Cough, unspecified: Secondary | ICD-10-CM | POA: Diagnosis not present

## 2021-06-25 DIAGNOSIS — B372 Candidiasis of skin and nail: Secondary | ICD-10-CM | POA: Diagnosis not present

## 2021-06-25 DIAGNOSIS — M47812 Spondylosis without myelopathy or radiculopathy, cervical region: Secondary | ICD-10-CM | POA: Diagnosis not present

## 2021-06-25 DIAGNOSIS — R062 Wheezing: Secondary | ICD-10-CM | POA: Diagnosis not present

## 2021-06-25 DIAGNOSIS — D649 Anemia, unspecified: Secondary | ICD-10-CM | POA: Diagnosis not present

## 2021-06-25 DIAGNOSIS — R0602 Shortness of breath: Secondary | ICD-10-CM | POA: Diagnosis not present

## 2021-06-25 DIAGNOSIS — J302 Other seasonal allergic rhinitis: Secondary | ICD-10-CM | POA: Diagnosis not present

## 2021-06-25 DIAGNOSIS — R0902 Hypoxemia: Secondary | ICD-10-CM | POA: Diagnosis not present

## 2021-06-25 DIAGNOSIS — I10 Essential (primary) hypertension: Secondary | ICD-10-CM | POA: Diagnosis not present

## 2021-06-25 DIAGNOSIS — I6523 Occlusion and stenosis of bilateral carotid arteries: Secondary | ICD-10-CM | POA: Diagnosis not present

## 2021-06-25 DIAGNOSIS — J189 Pneumonia, unspecified organism: Secondary | ICD-10-CM | POA: Diagnosis not present

## 2021-06-25 DIAGNOSIS — J9601 Acute respiratory failure with hypoxia: Secondary | ICD-10-CM | POA: Diagnosis not present

## 2021-06-25 DIAGNOSIS — I2510 Atherosclerotic heart disease of native coronary artery without angina pectoris: Secondary | ICD-10-CM | POA: Diagnosis not present

## 2021-09-09 DEATH — deceased
